# Patient Record
Sex: Female | Born: 1990 | Race: White | Hispanic: No | Marital: Married | Smoking: Never smoker
Health system: Southern US, Community
[De-identification: ages and names within clinical notes are randomized; demographics above are authoritative.]

## PROBLEM LIST (undated history)

## (undated) DIAGNOSIS — E669 Obesity, unspecified: Secondary | ICD-10-CM

## (undated) DIAGNOSIS — G43909 Migraine, unspecified, not intractable, without status migrainosus: Secondary | ICD-10-CM

## (undated) DIAGNOSIS — N946 Dysmenorrhea, unspecified: Secondary | ICD-10-CM

## (undated) HISTORY — DX: Migraine, unspecified, not intractable, without status migrainosus: G43.909

## (undated) HISTORY — DX: Obesity, unspecified: E66.9

## (undated) HISTORY — DX: Dysmenorrhea, unspecified: N94.6

## (undated) HISTORY — PX: OTHER SURGICAL HISTORY: SHX169

---
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
## 2008-05-11 ENCOUNTER — Ambulatory Visit: Payer: Self-pay | Admitting: Internal Medicine

## 2008-05-11 IMAGING — US ABDOMEN ULTRASOUND
1 series · 17 of 25 positions shown · non-contrast
Comparison: none

REASON FOR EXAM: abd pain
COMMENTS:

[Series 1: abdomen ultrasound · 17 of 72 slices shown]
[im 1/72]
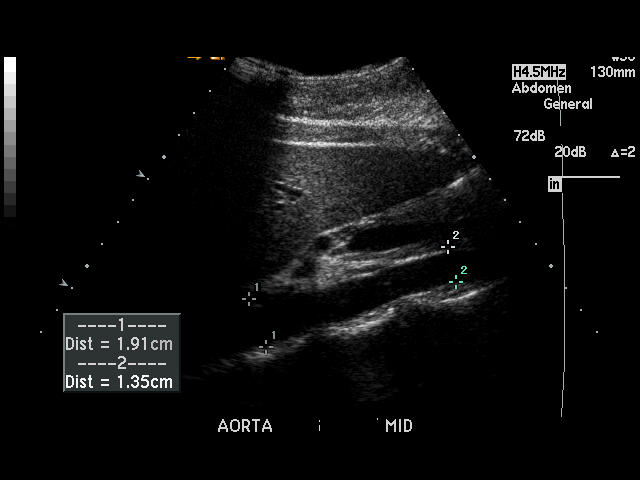
[im 6/72]
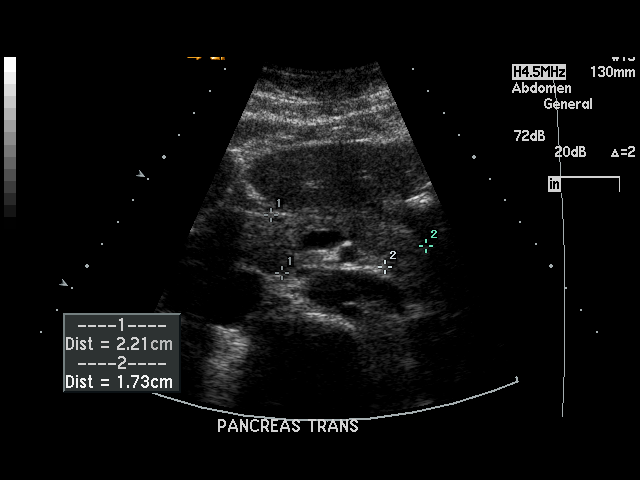
[im 9/72]
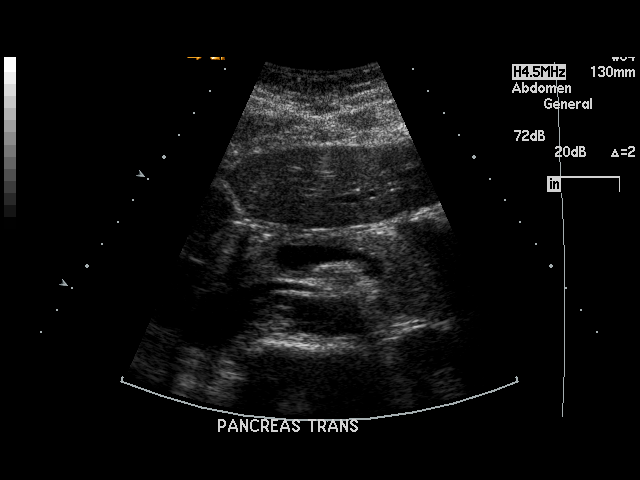
[im 15/72]
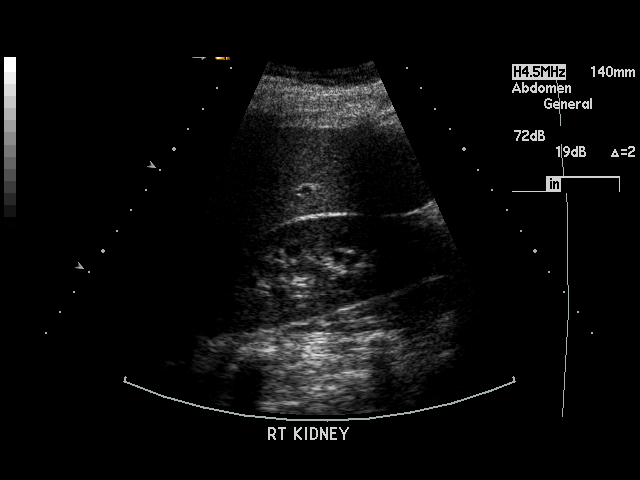
[im 18/72]
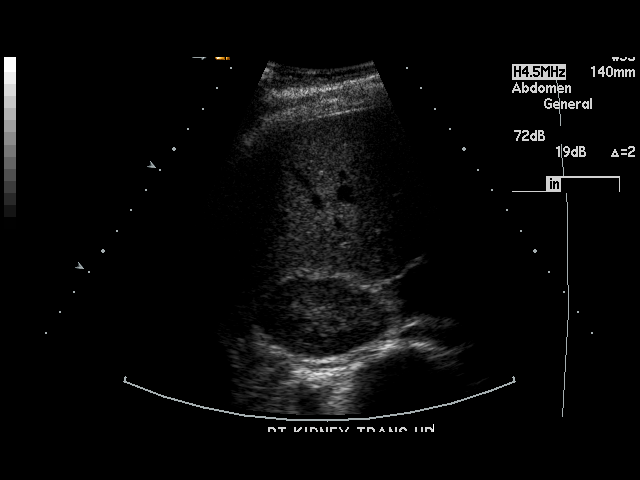
[im 24/72]
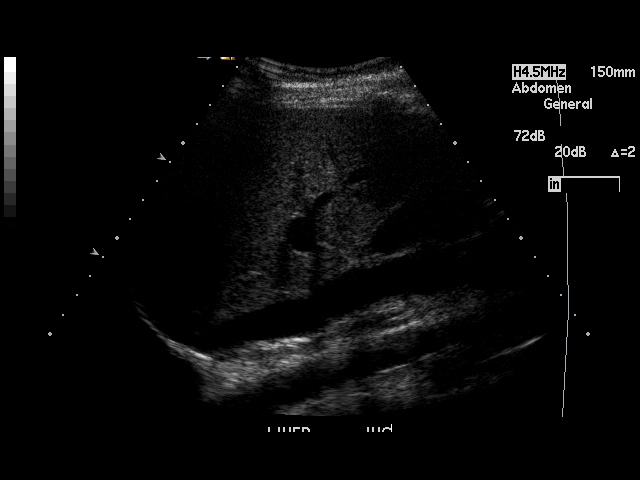
[im 27/72]
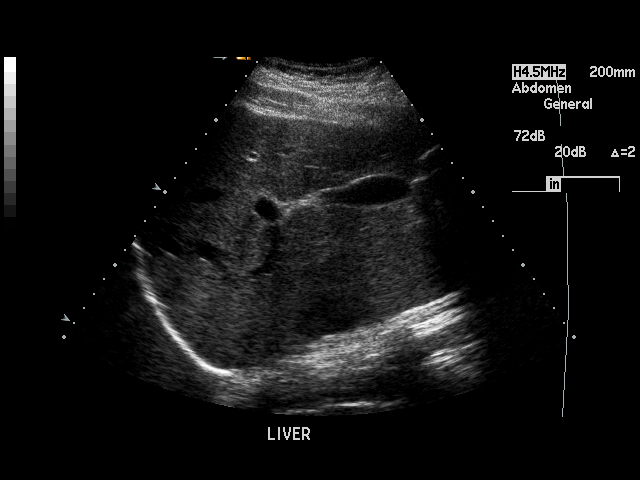
[im 33/72]
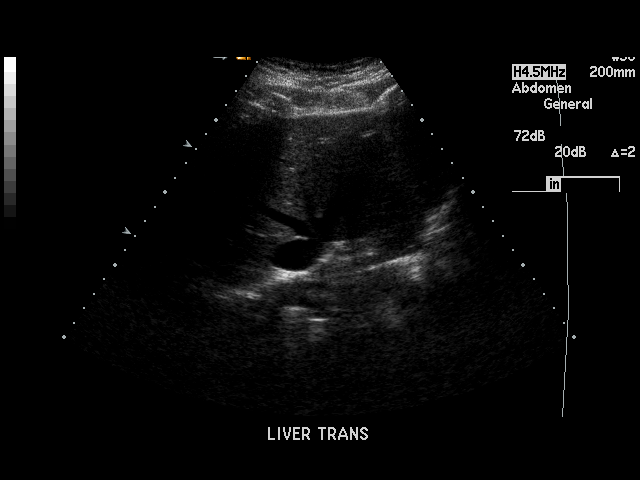
[im 36/72]
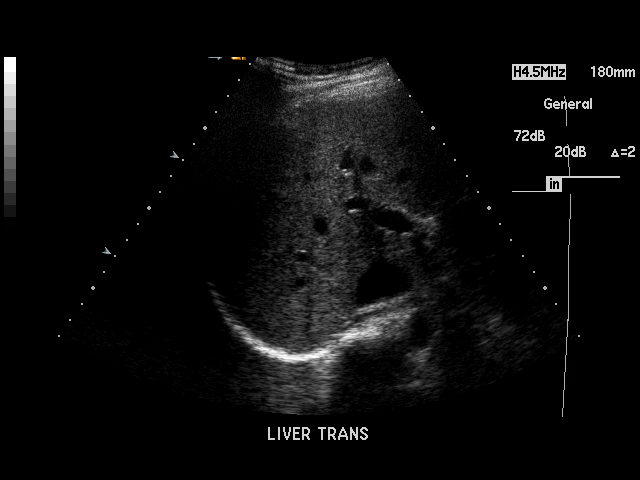
[im 39/72]
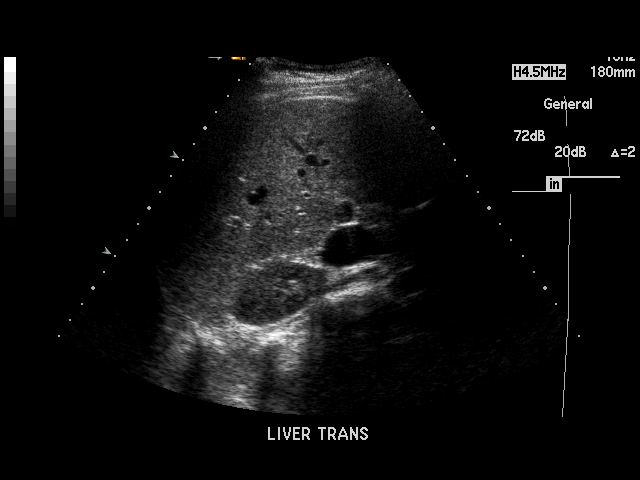
[im 45/72]
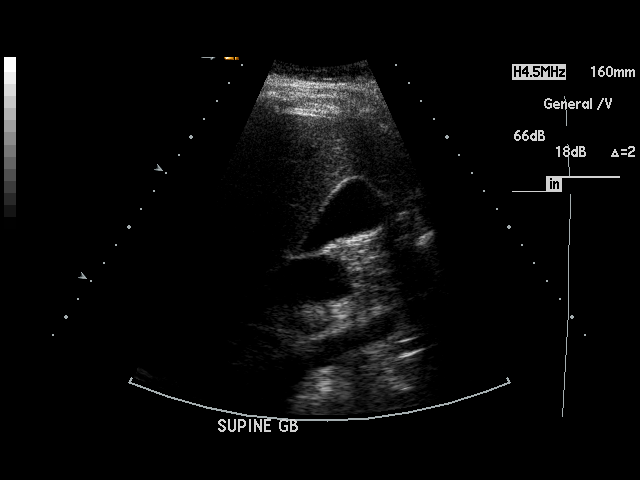
[im 48/72]
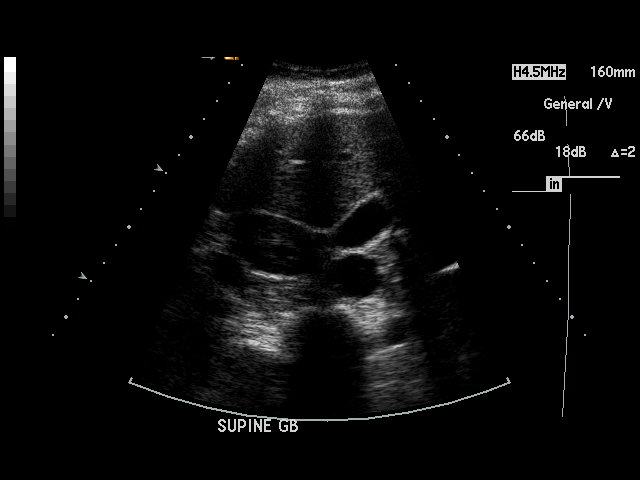
[im 54/72]
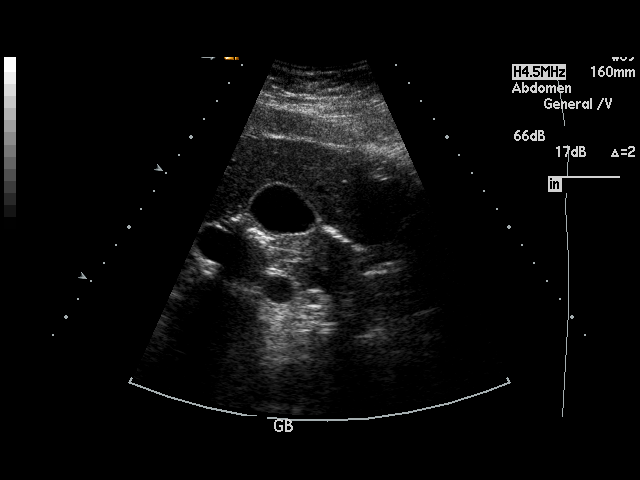
[im 57/72]
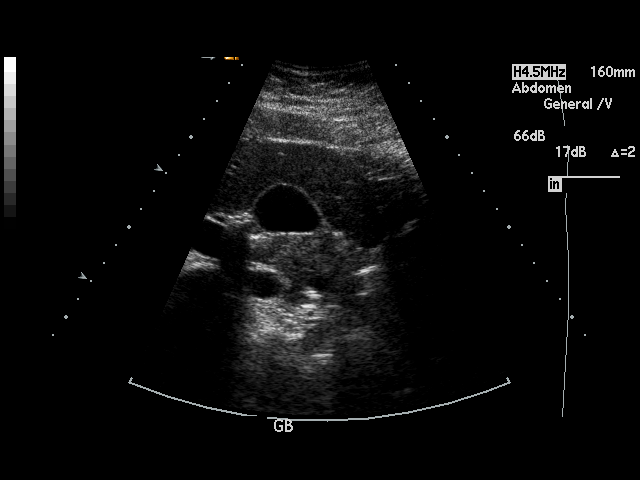
[im 63/72]
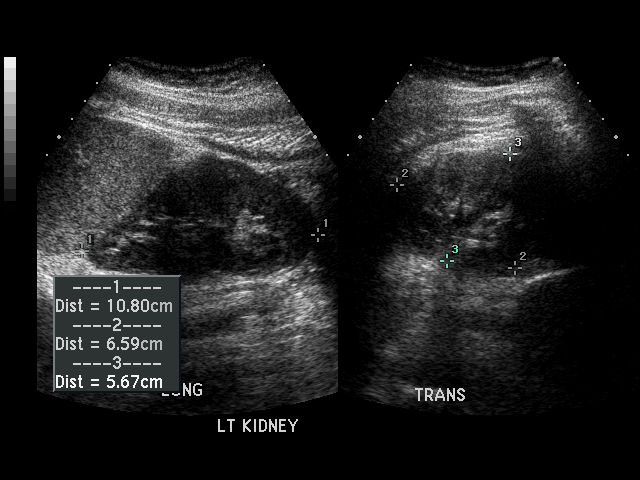
[im 66/72]
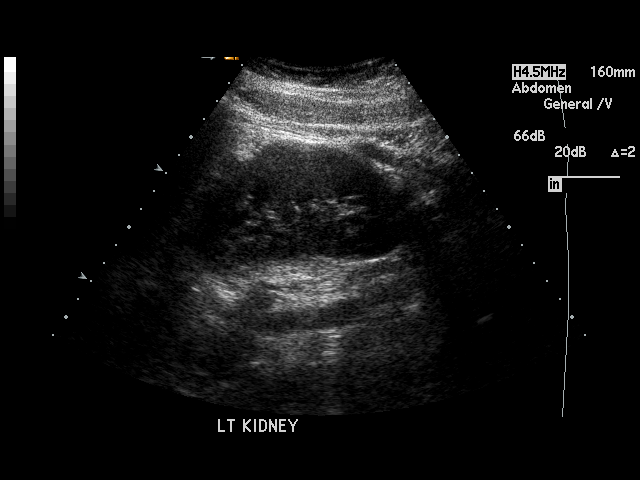
[im 72/72]
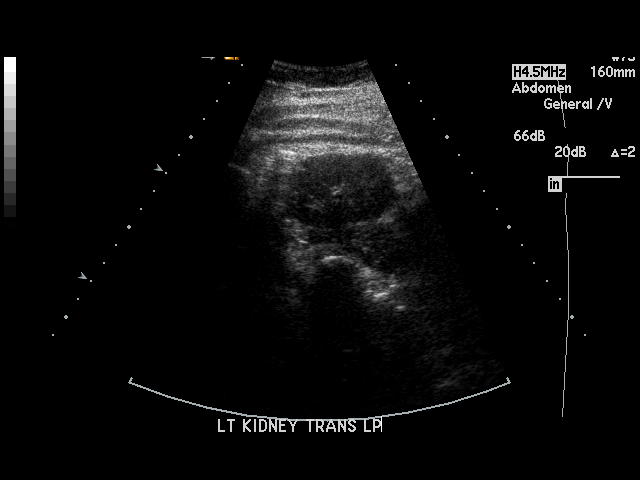

[17 of 25 positions shown; findings below may reference images not displayed]

PROCEDURE:     US  - US ABDOMEN GENERAL SURVEY  - [DATE]  [DATE]

RESULT:     Ultrasound of the abdomen is performed.

The patient reports a history of previous splenic fracture or laceration.
The study demonstrates a normal appearance of the visualized abdominal aorta
and inferior vena cava. The included portion of the pancreas appears
unremarkable. The liver shows normal echotexture with no mass or ductal
dilation. The portal venous flow is appropriate. The gallbladder shows no
evidence of stones. The wall thickness is 2.3 mm. The common bile duct
diameter is 3.3 mm. The spleen does show evidence of a central linear
increased echogenic appearance which may represent fibrosis from previous
trauma. The kidneys are unremarkable.
IMPRESSION: Probable fibrosis from a previous splenic injury. No acute
abnormality.

## 2008-08-04 ENCOUNTER — Ambulatory Visit: Payer: Self-pay | Admitting: Gastroenterology

## 2008-08-10 ENCOUNTER — Ambulatory Visit: Payer: Self-pay | Admitting: Gastroenterology

## 2008-08-10 IMAGING — CR DG SMALL BOWEL
1 series · 5 of 5 positions shown · non-contrast
Comparison: none

REASON FOR EXAM: nausea vomiting periumbilical pain
COMMENTS:

PROCEDURE:     FL  - FL SMALL BOWEL  - [DATE] [DATE]
RESULT:     Indication: Nausea and vomiting

[Series 1: view not recorded · 0.17mm/px · 5 of 5 slices shown]
[im 1/5]
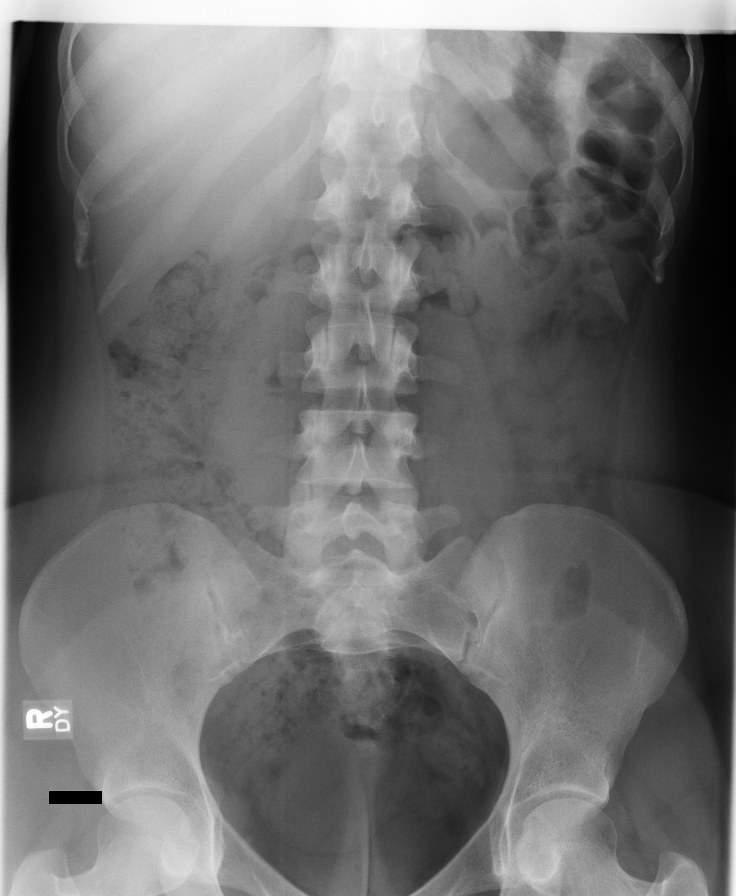
[im 2/5]
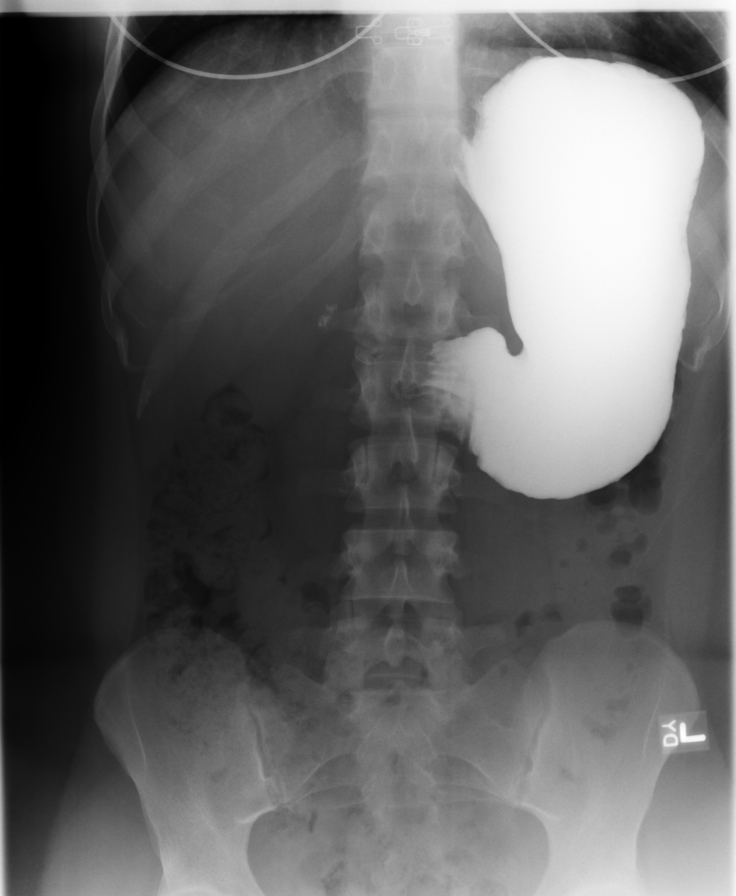
[im 3/5]
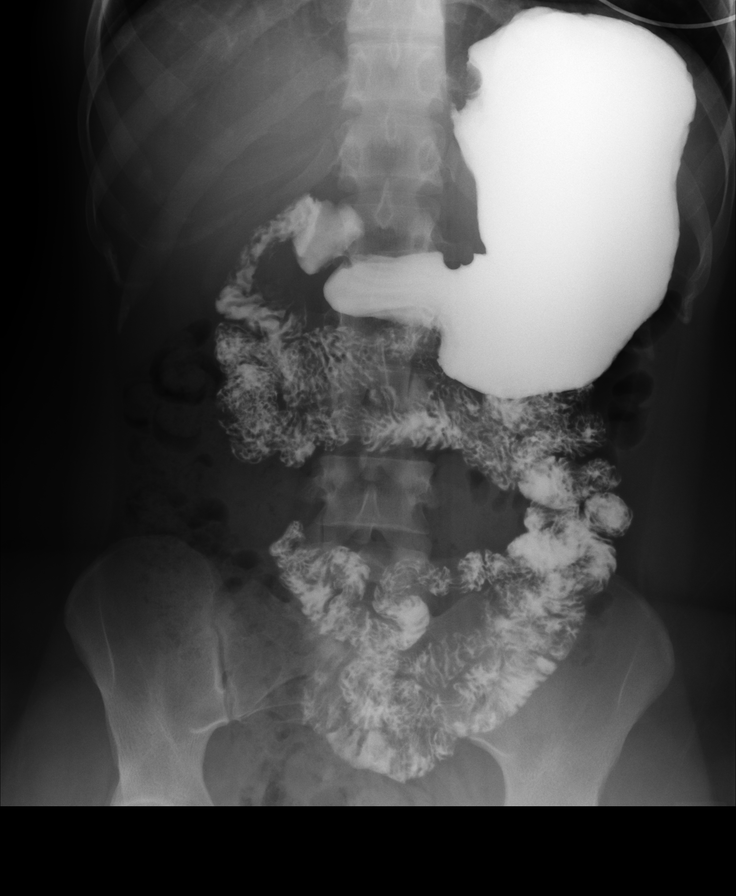
[im 4/5]
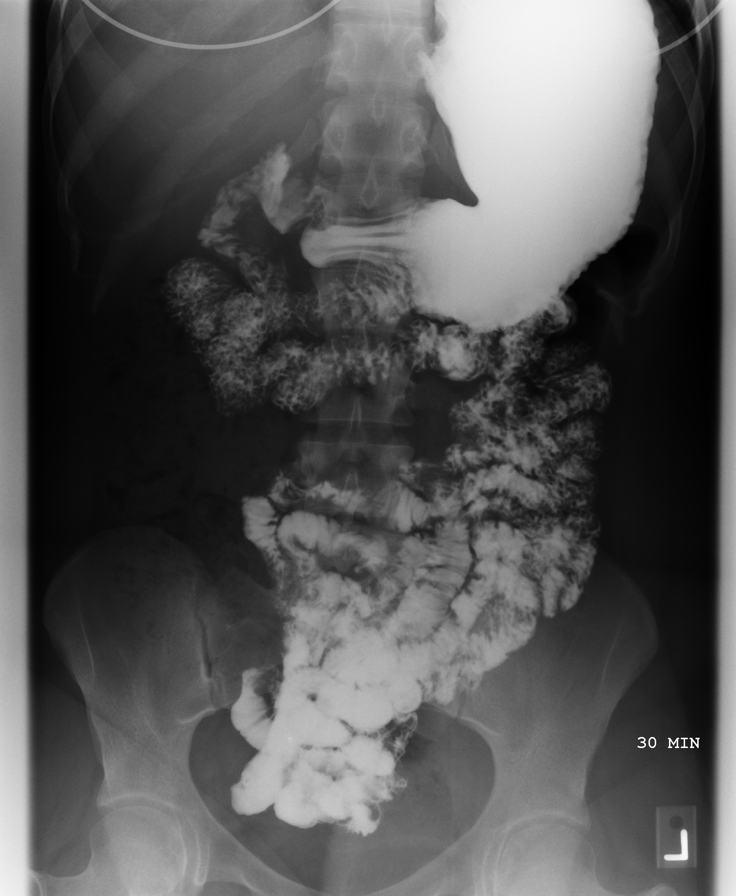
[im 5/5]
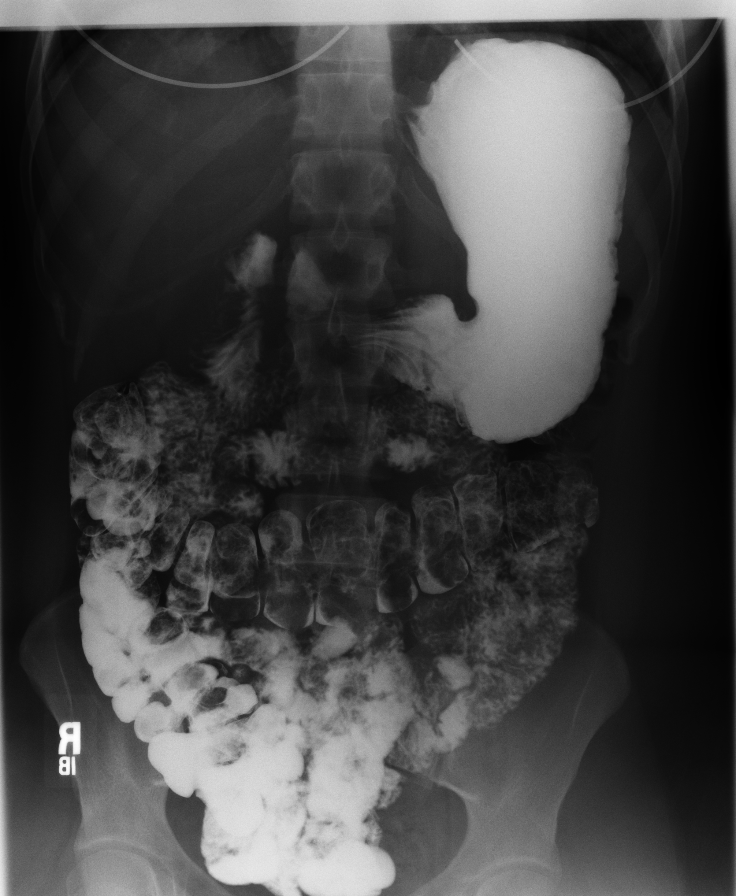

[5 of 5 positions shown; findings below may reference images not displayed]

FINDINGS: Scout frontal abdominal and pelvic radiograph demonstrates a nonspecific
bowel gas pattern..

Medium density barium was periodically observed under fluoroscopy to travel
from the stomach to the ascending colon (over a 45 minute time period).
There is no evidence of small bowel stricture or obstruction. No large
filling defects to suggest mass lesion. In addition, there is no evidence of
tethering or definite inflammatory changes present within the small bowel.
IMPRESSION: Normal small bowel follow-through study without evidence of tethering, mass
lesion, or obstruction within the small bowel.

## 2008-08-10 IMAGING — RF DG SMALL BOWEL
1 series · 6 of 6 positions shown · non-contrast
Comparison: none

REASON FOR EXAM: nausea vomiting periumbilical pain
COMMENTS:

PROCEDURE:     FL  - FL SMALL BOWEL  - [DATE] [DATE]
RESULT:     Indication: Nausea and vomiting

[Series 1: run · 6 of 6 slices shown]
[im 1/6]
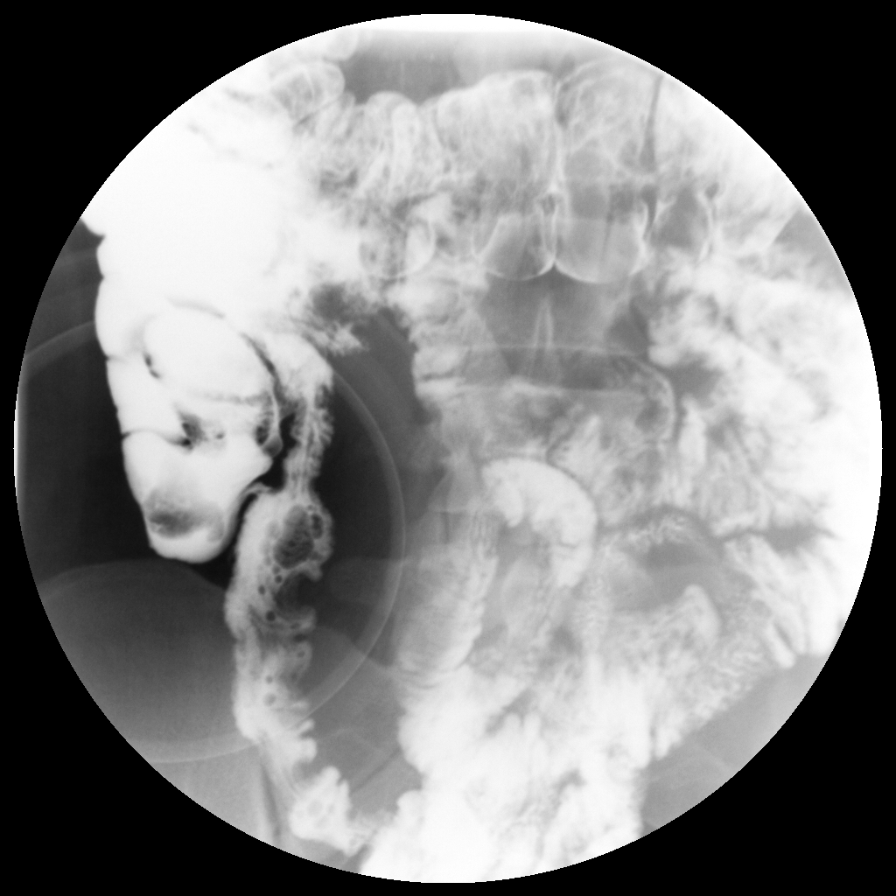
[im 2/6]
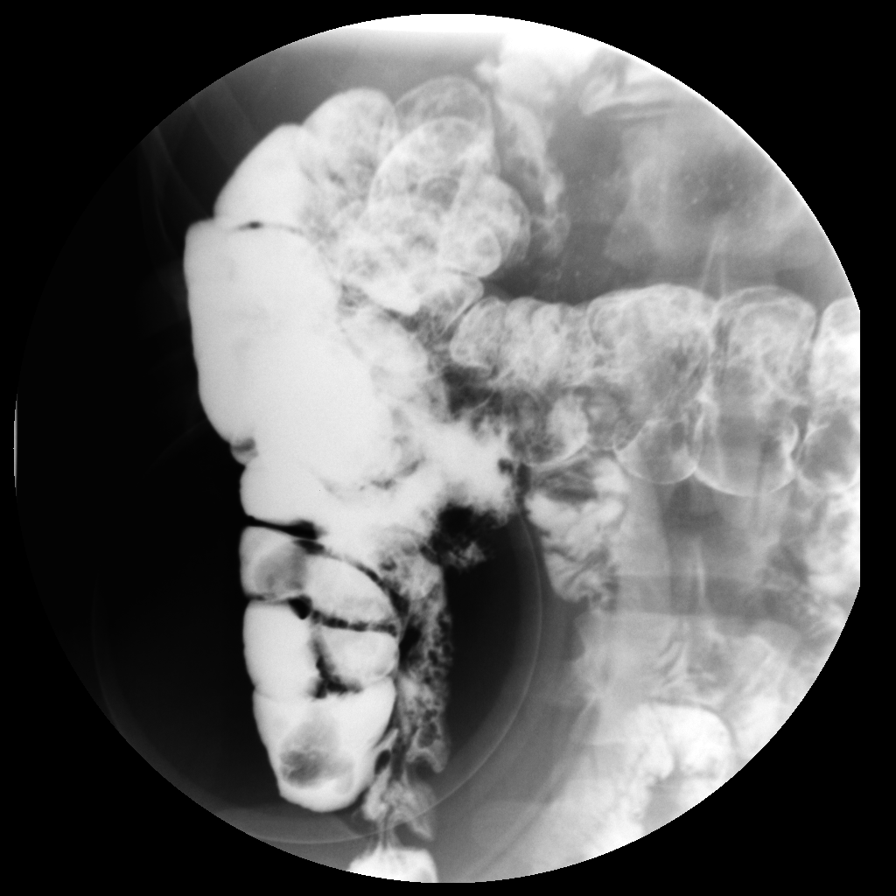
[im 3/6]
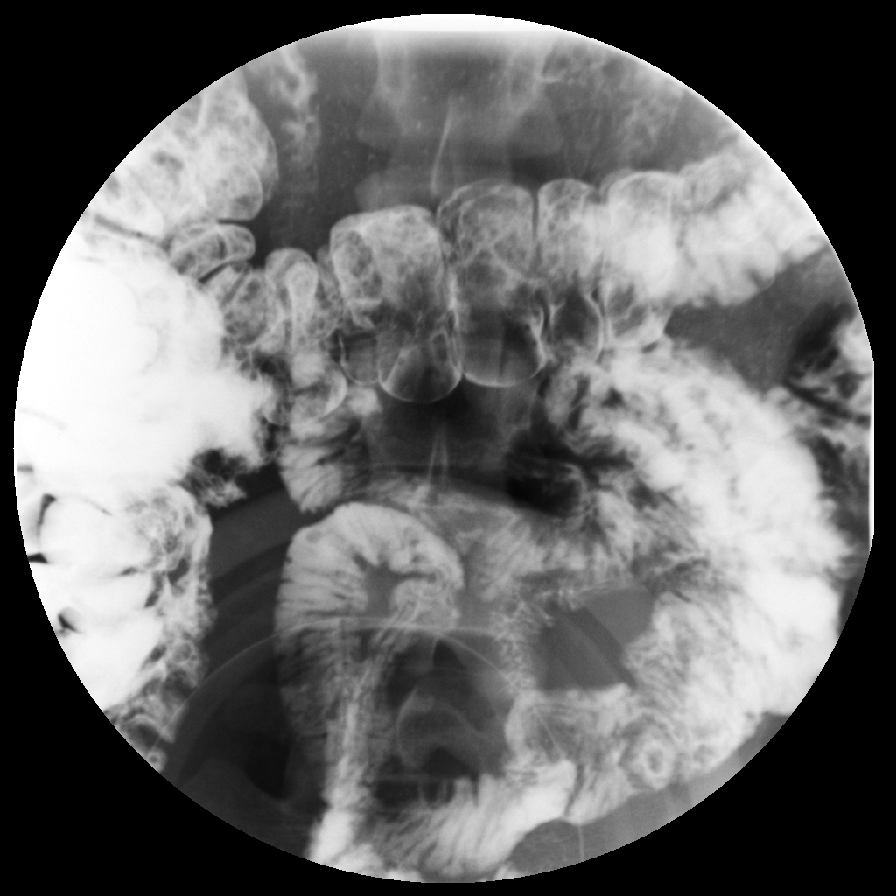
[im 4/6]
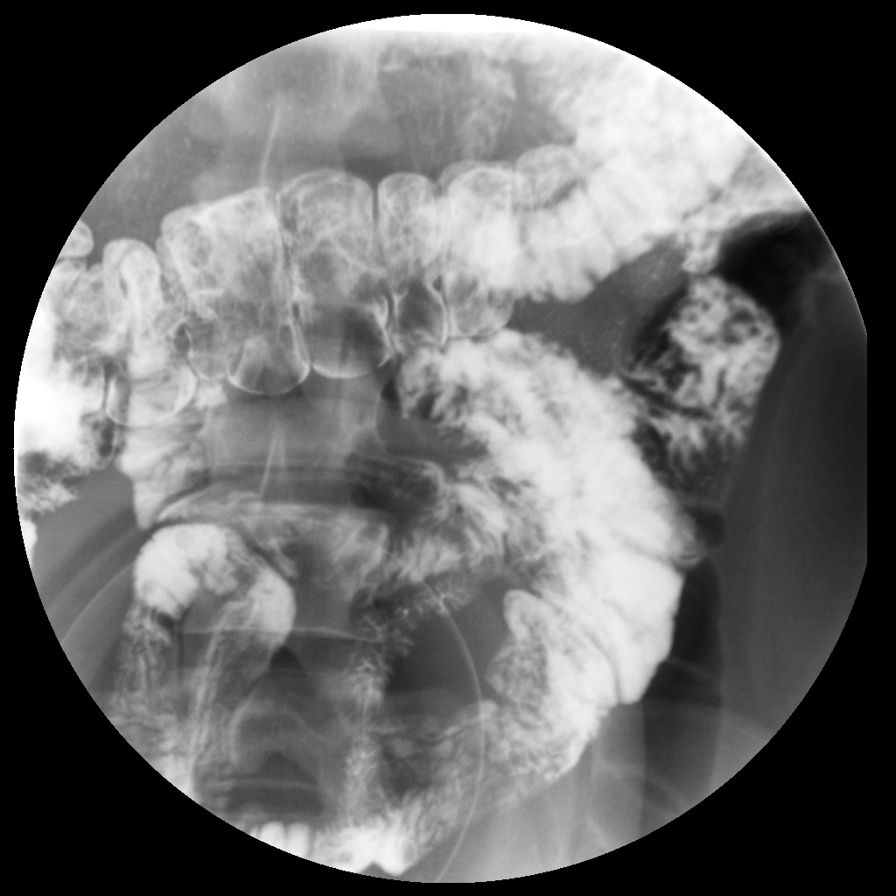
[im 5/6]
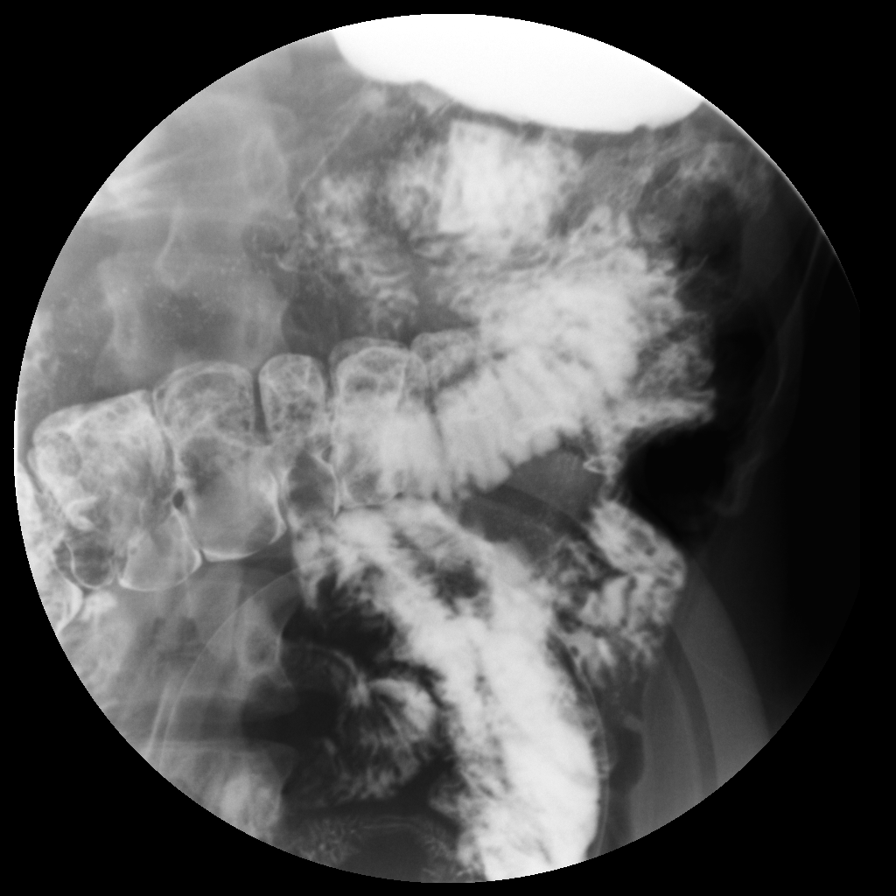
[im 6/6]
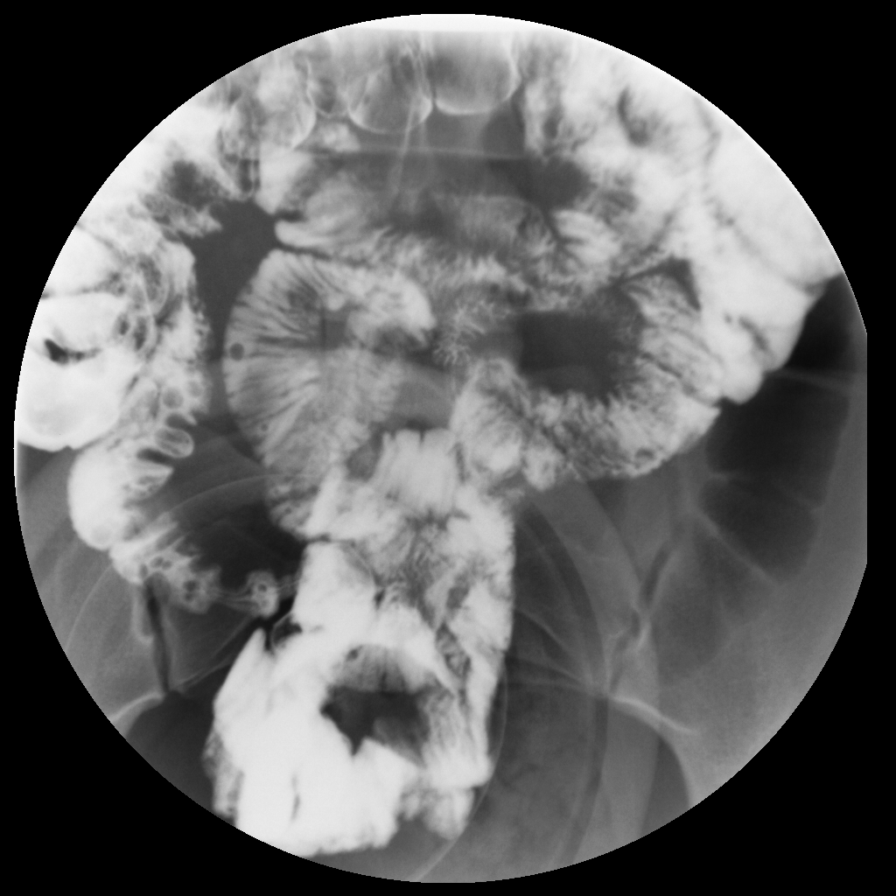

[6 of 6 positions shown; findings below may reference images not displayed]

FINDINGS: Scout frontal abdominal and pelvic radiograph demonstrates a nonspecific
bowel gas pattern..

Medium density barium was periodically observed under fluoroscopy to travel
from the stomach to the ascending colon (over a 45 minute time period).
There is no evidence of small bowel stricture or obstruction. No large
filling defects to suggest mass lesion. In addition, there is no evidence of
tethering or definite inflammatory changes present within the small bowel.
IMPRESSION: Normal small bowel follow-through study without evidence of tethering, mass
lesion, or obstruction within the small bowel.

## 2008-12-15 ENCOUNTER — Ambulatory Visit: Payer: Self-pay | Admitting: Internal Medicine

## 2008-12-15 IMAGING — CT CT HEAD WITHOUT CONTRAST
2 series · 16 of 30 positions shown, 20 images · non-contrast
Comparison: none

REASON FOR EXAM: headache   worse of life
COMMENTS:

PROCEDURE:     CT  - CT HEAD WITHOUT CONTRAST  - [DATE]  [DATE]
RESULT:     Comparison:  None
TECHNIQUE: Multiple axial images from the foramen magnum to the vertex were
obtained without IV contrast.

[Series 2: without · axial · non-contrast · 0.41mm/px · z∈[-35,+85]mm · 13 of 29 slices shown, 17 images]
[im 3/29  brain]
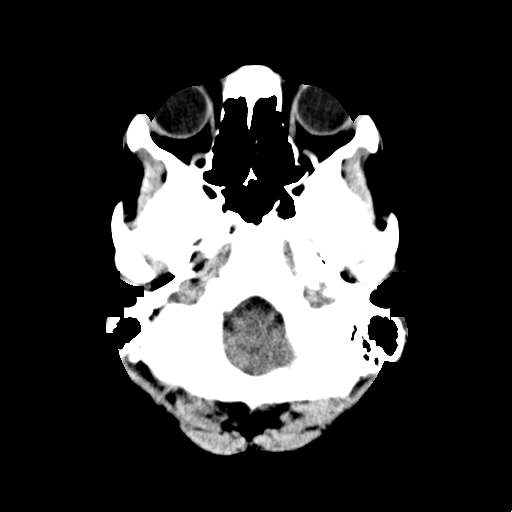
[im 3/29  bone]
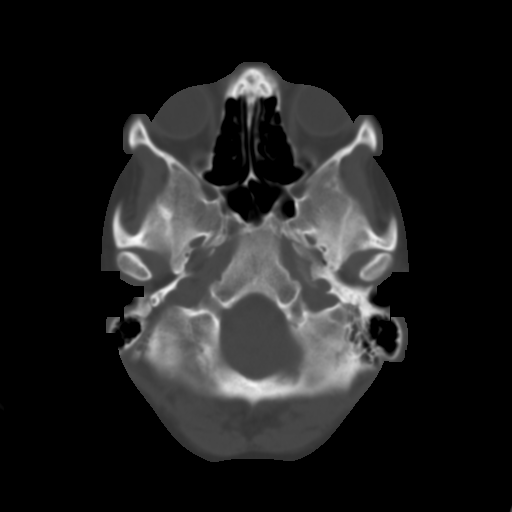
[im 5/29  brain]
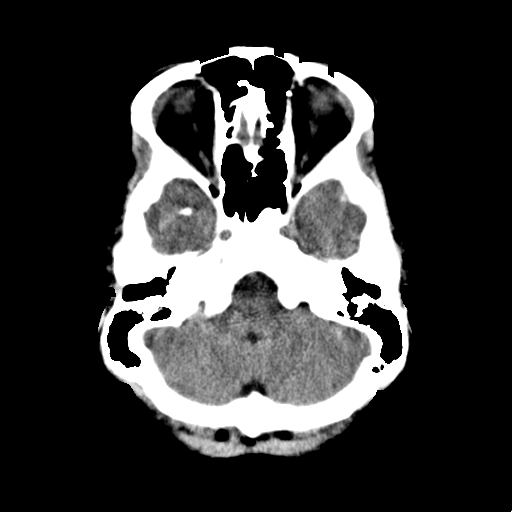
[im 7/29  brain]
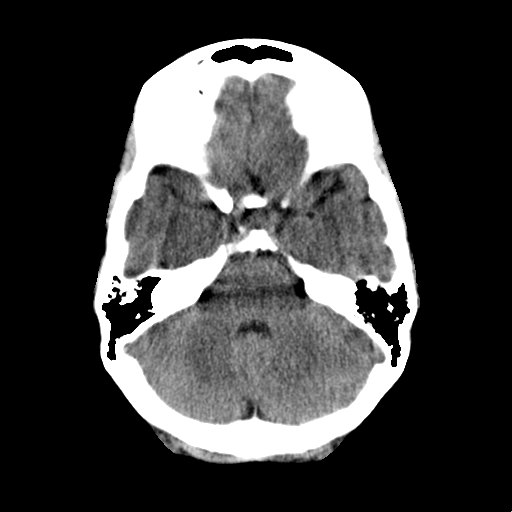
[im 9/29  brain]
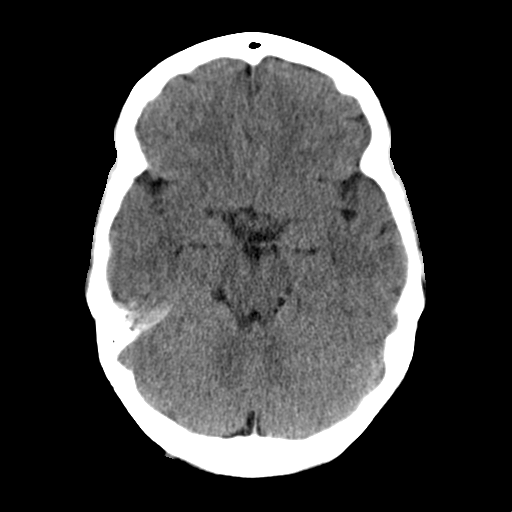
[im 11/29  brain]
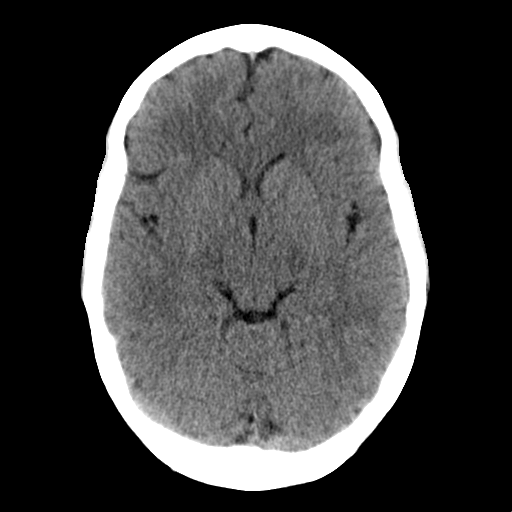
[im 11/29  bone]
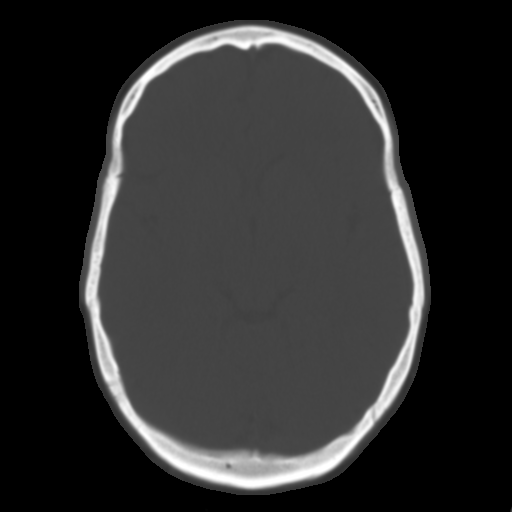
[im 13/29  brain]
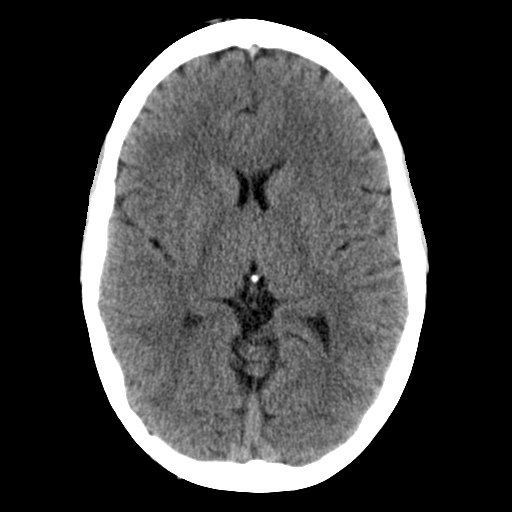
[im 15/29  brain]
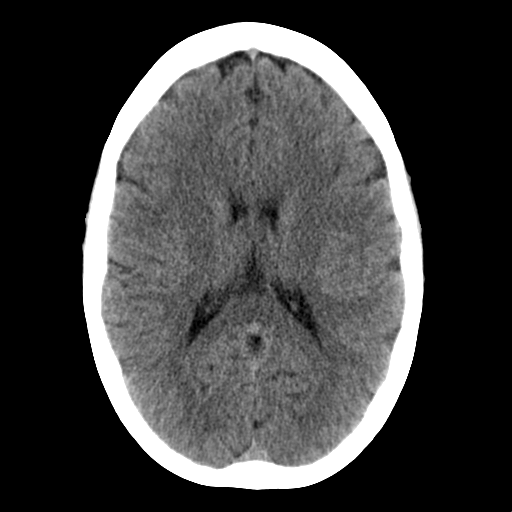
[im 17/29  brain]
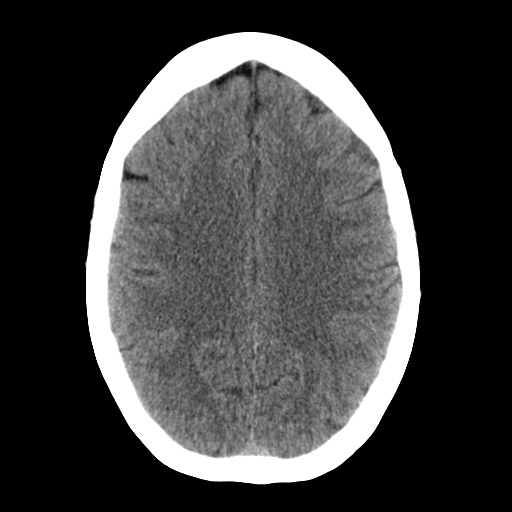
[im 19/29  brain]
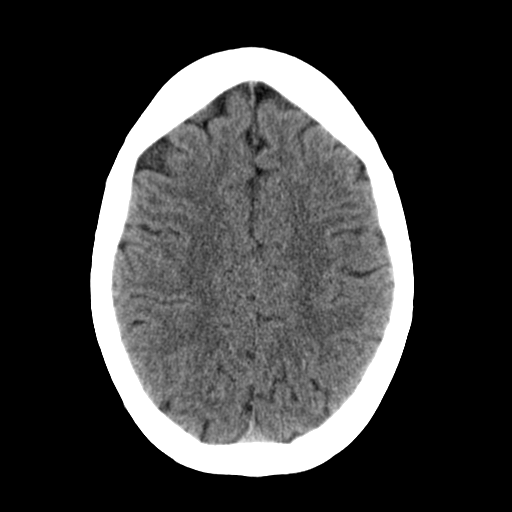
[im 19/29  bone]
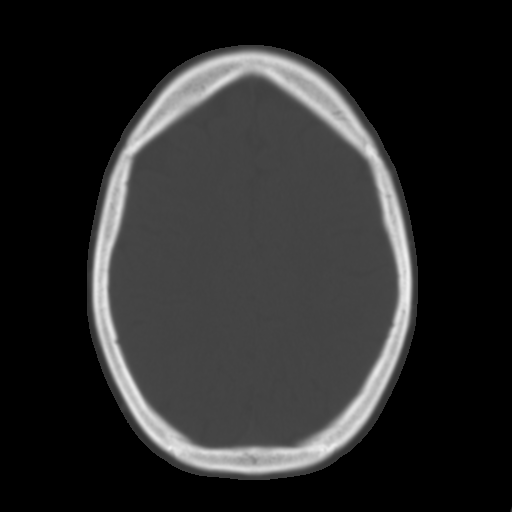
[im 21/29  brain]
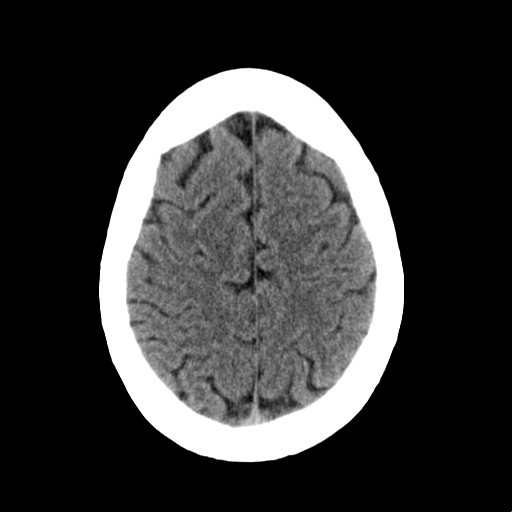
[im 23/29  brain]
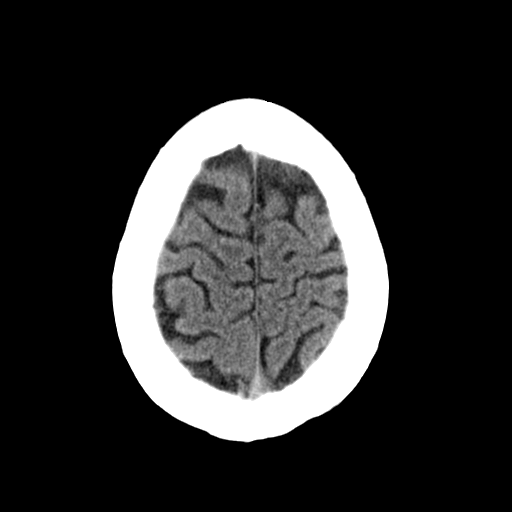
[im 25/29  brain]
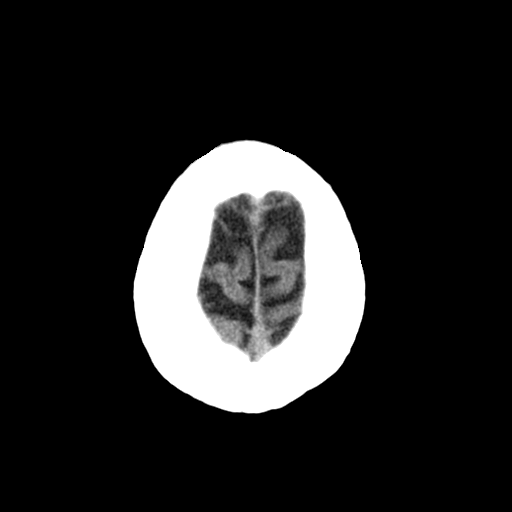
[im 27/29  brain]
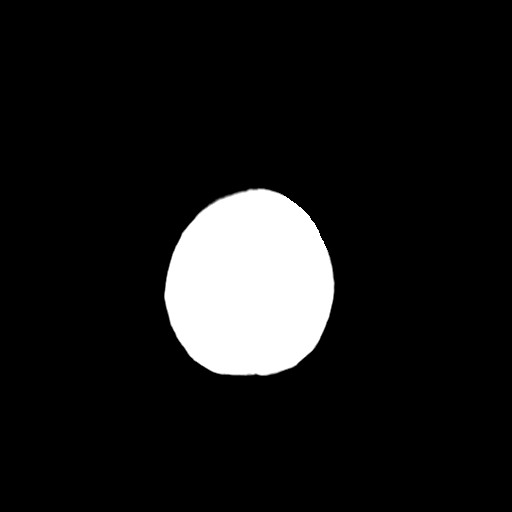
[im 27/29  bone]
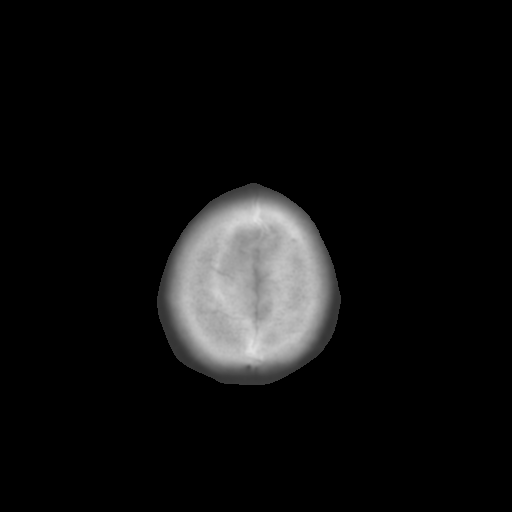

[Series 3: bone · axial · 0.41mm/px · z∈[-35,+5]mm · 3 of 29 slices shown]
[im 3/29  bone]
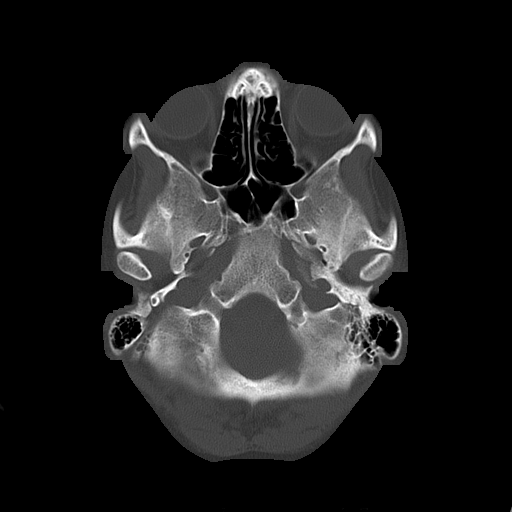
[im 7/29  bone]
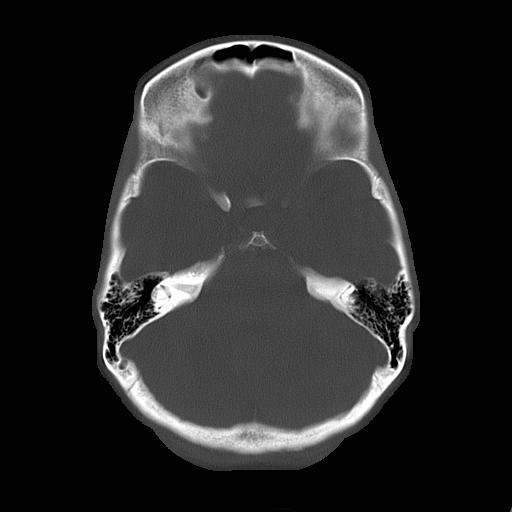
[im 11/29  bone]
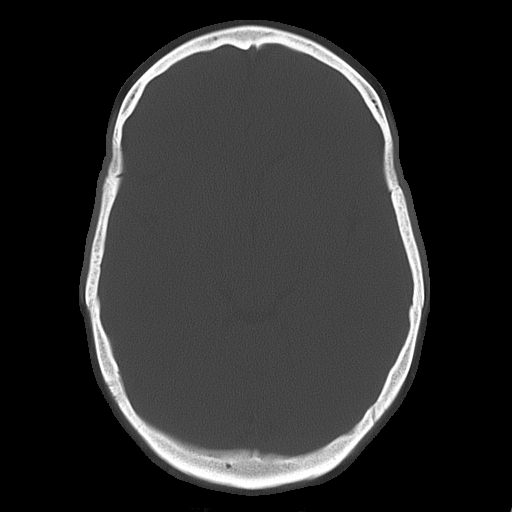

[16 of 30 positions shown; findings below may reference images not displayed]

FINDINGS: There is no evidence of mass effect, midline shift, or extra-axial fluid
collections.  There is no evidence of a space-occupying lesion or
intracranial hemorrhage. There is no evidence of a cortical-based area of
acute infarction.

The ventricles and sulci are appropriate for the patient's age. The basal
cisterns are patent.

Visualized portions of the orbits are unremarkable. The visualized portions
of the paranasal sinuses and mastoid air cells are unremarkable.

The osseous structures are unremarkable.
IMPRESSION: No acute intracranial process.

## 2009-01-08 ENCOUNTER — Emergency Department: Payer: Self-pay | Admitting: Unknown Physician Specialty

## 2009-08-28 ENCOUNTER — Emergency Department: Payer: Self-pay | Admitting: Emergency Medicine

## 2010-09-18 ENCOUNTER — Ambulatory Visit: Payer: Self-pay | Admitting: Neurology

## 2011-02-03 ENCOUNTER — Emergency Department: Payer: Self-pay | Admitting: Emergency Medicine

## 2011-02-03 IMAGING — CT CT HEAD WITHOUT CONTRAST
2 series · 16 of 30 positions shown, 20 images · non-contrast
Comparison: none

REASON FOR EXAM: headache
COMMENTS:

[Series 2: without · axial · non-contrast · 0.41mm/px · z∈[-198,-78]mm · 13 of 30 slices shown, 17 images]
[im 3/30  brain]
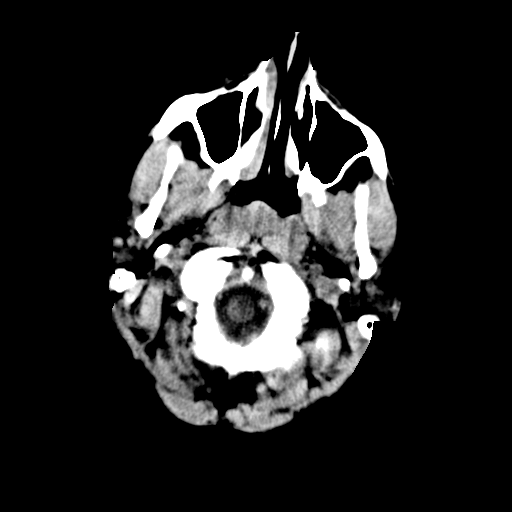
[im 3/30  bone]
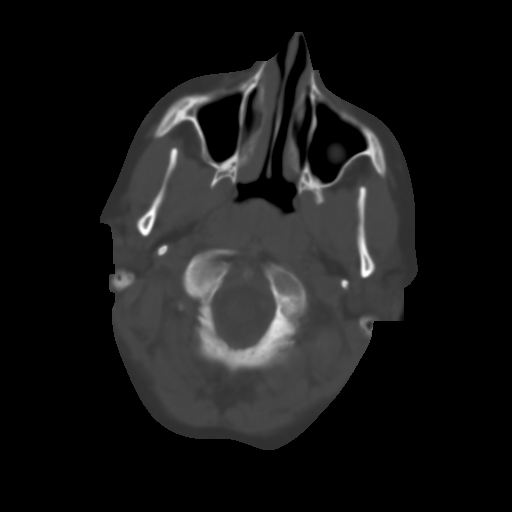
[im 5/30  brain]
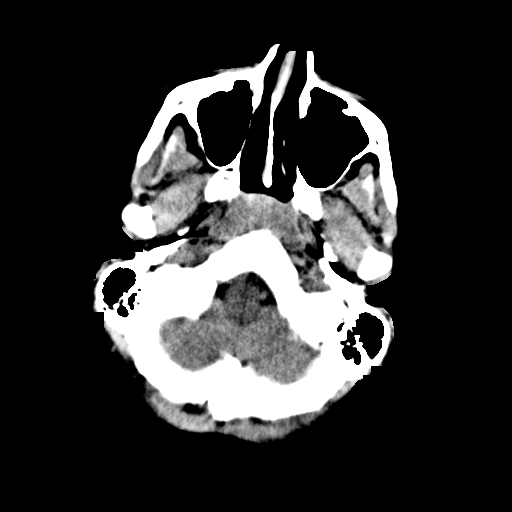
[im 7/30  brain]
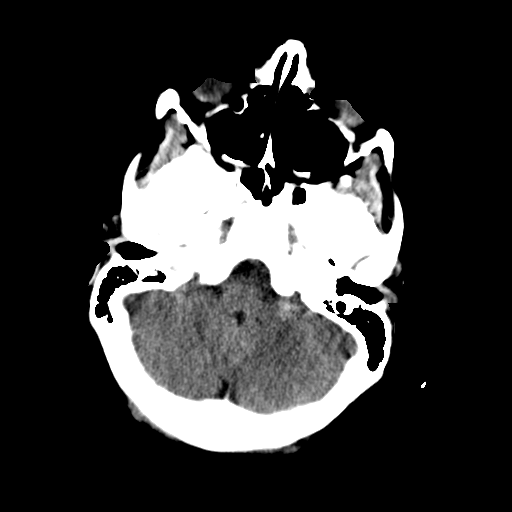
[im 9/30  brain]
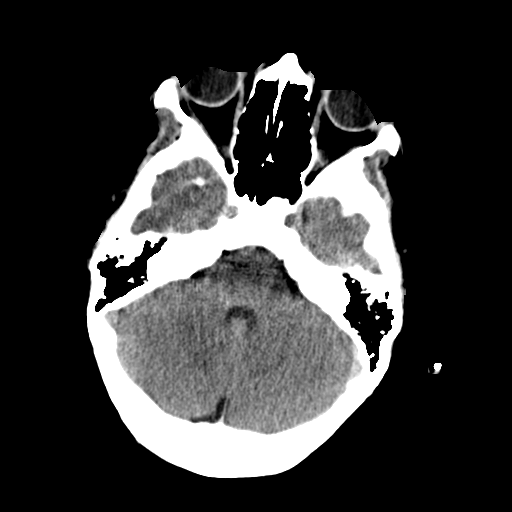
[im 11/30  brain]
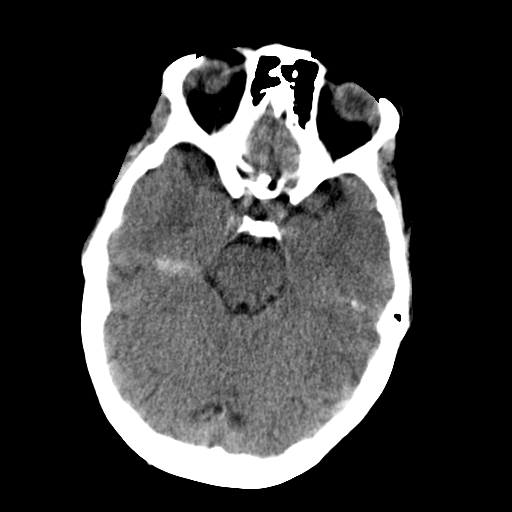
[im 11/30  bone]
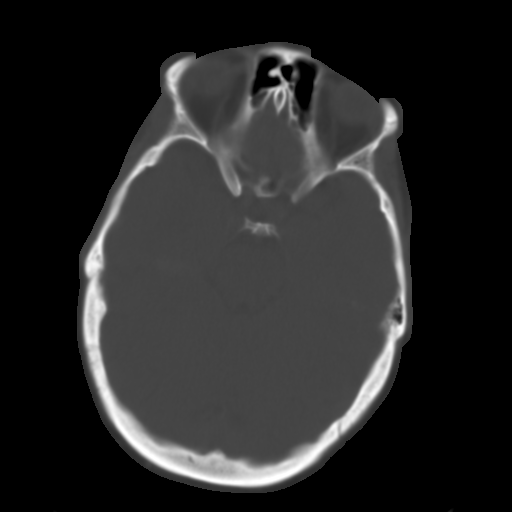
[im 13/30  brain]
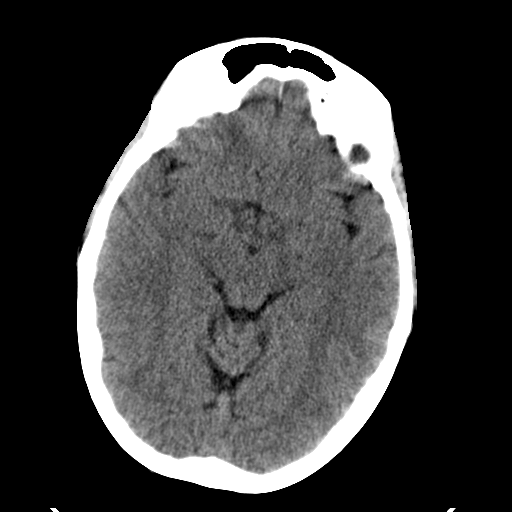
[im 15/30  brain]
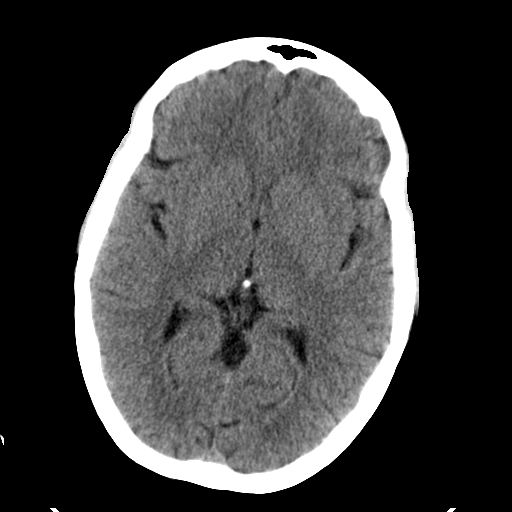
[im 17/30  brain]
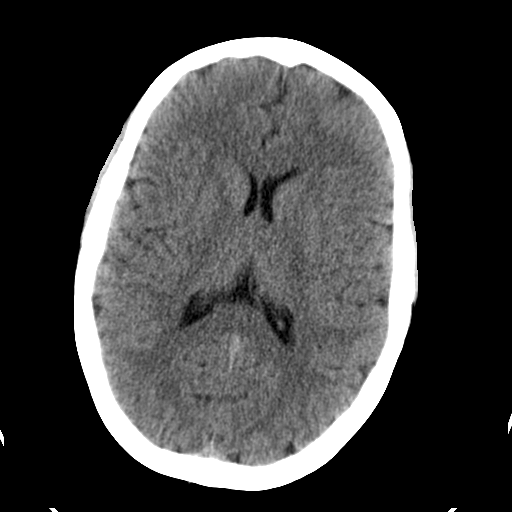
[im 19/30  brain]
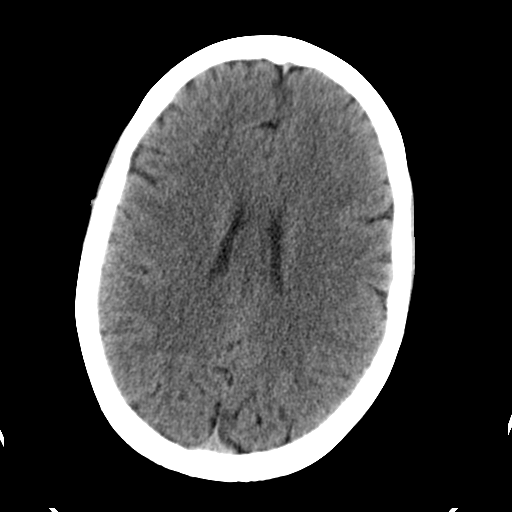
[im 19/30  bone]
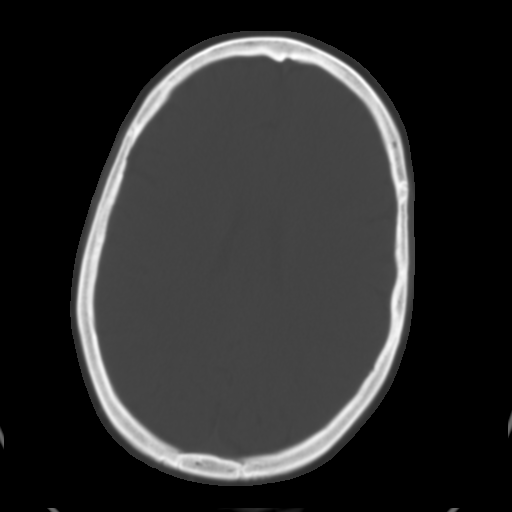
[im 21/30  brain]
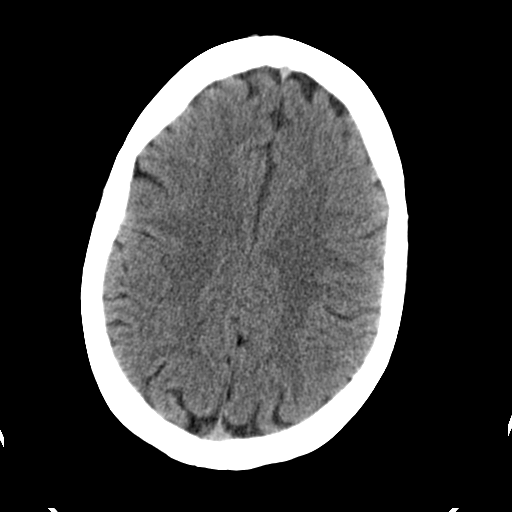
[im 23/30  brain]
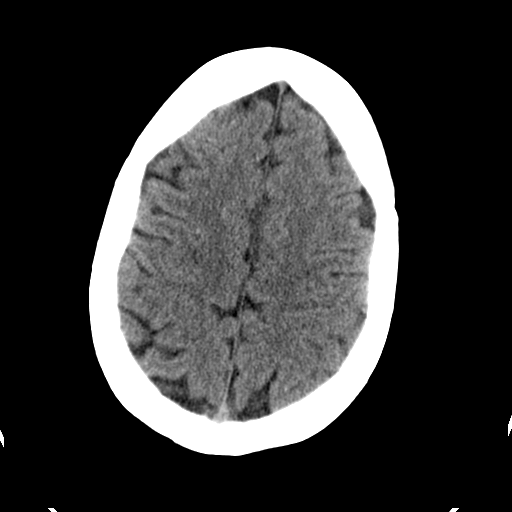
[im 25/30  brain]
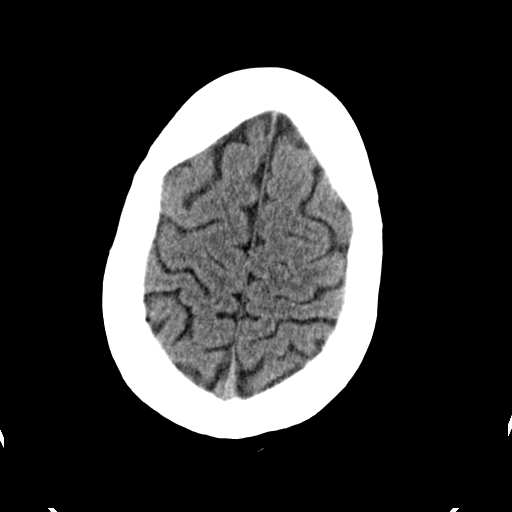
[im 27/30  brain]
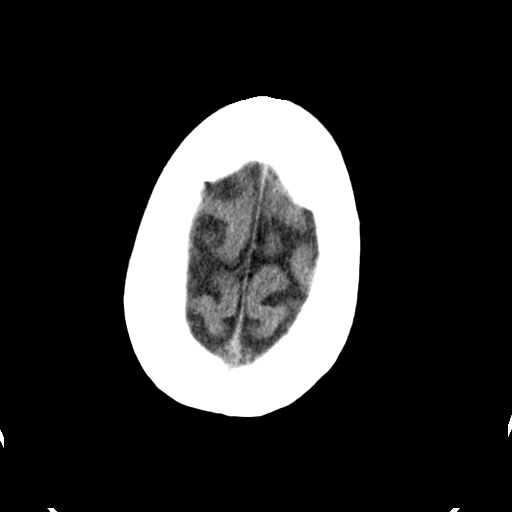
[im 27/30  bone]
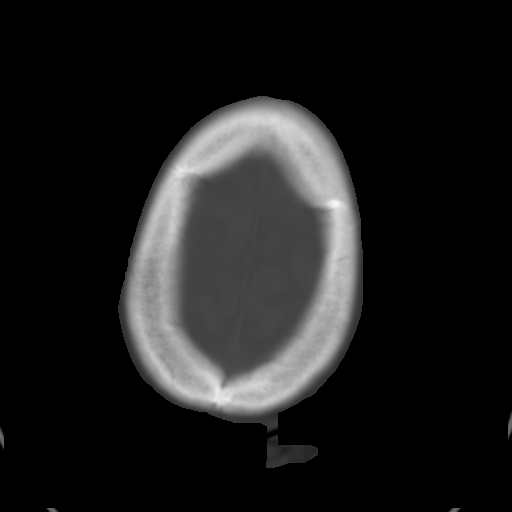

[Series 3: bone · axial · 0.41mm/px · z∈[-198,-158]mm · 3 of 30 slices shown]
[im 3/30  bone]
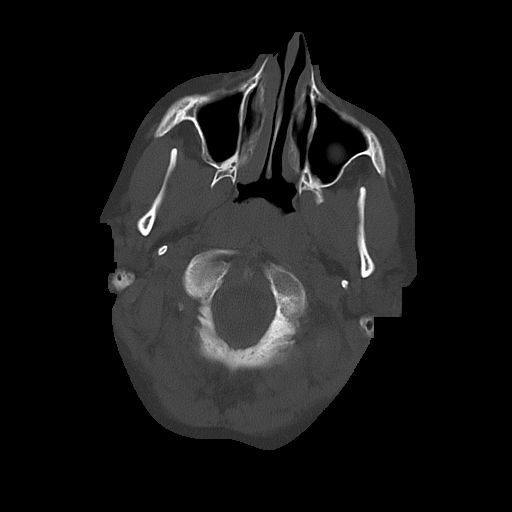
[im 7/30  bone]
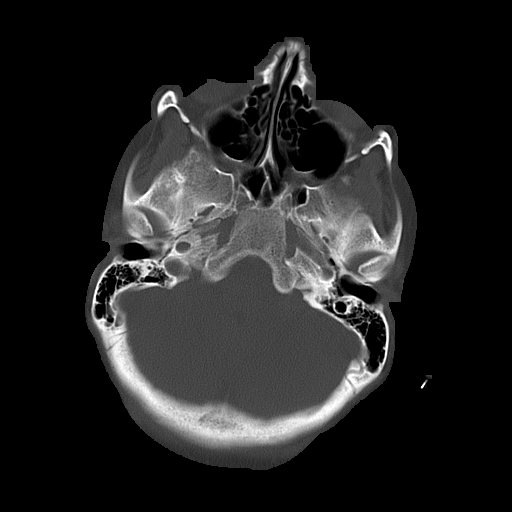
[im 11/30  bone]
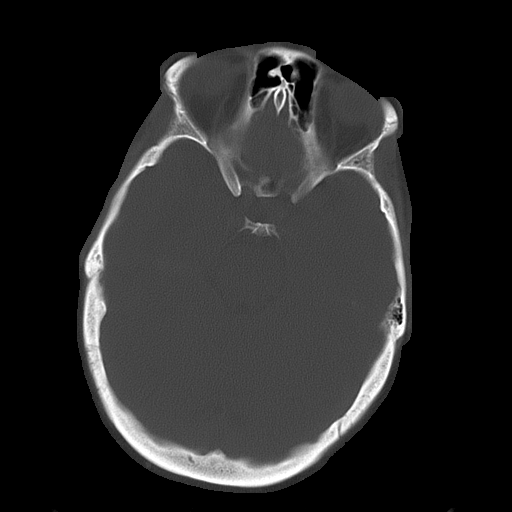

[16 of 30 positions shown; findings below may reference images not displayed]

PROCEDURE:     CT  - CT HEAD WITHOUT CONTRAST  - [DATE]  [DATE]

RESULT:     Axial noncontrast CT scanning was performed through the brain
with reconstructions at 5 mm intervals and slice thicknesses. Comparison
made to study [DATE].

The ventricles are normal in size and position. There is no intracranial
hemorrhage nor intracranial mass effect. The cerebellum and brainstem are
normal in density. At bone window settings the observed portions of the
paranasal sinuses and mastoid air cells are clear. There is likely a
retention cyst inferiorly in the left maxillary sinus.
IMPRESSION: Normal noncontrast CT scan of the brain.

## 2011-02-04 ENCOUNTER — Emergency Department: Payer: Self-pay | Admitting: Emergency Medicine

## 2011-11-01 ENCOUNTER — Emergency Department: Payer: Self-pay | Admitting: Unknown Physician Specialty

## 2011-11-01 LAB — COMPREHENSIVE METABOLIC PANEL
Albumin: 3.9 g/dL (ref 3.4–5.0)
Alkaline Phosphatase: 75 U/L (ref 50–136)
Anion Gap: 9 (ref 7–16)
Bilirubin,Total: 0.3 mg/dL (ref 0.2–1.0)
Calcium, Total: 9.3 mg/dL (ref 8.5–10.1)
Chloride: 107 mmol/L (ref 98–107)
Co2: 26 mmol/L (ref 21–32)
EGFR (African American): >60
EGFR (Non-African Amer.): >60
Glucose: 94 mg/dL (ref 65–99)
Osmolality: 283 (ref 275–301)
Potassium: 3.6 mmol/L (ref 3.5–5.1)
Sodium: 142 mmol/L (ref 136–145)

## 2011-11-01 LAB — CBC
HCT: 39.8 % (ref 35.0–47.0)
MCHC: 34.2 g/dL (ref 32.0–36.0)
MCV: 90 fL (ref 80–100)
RDW: 12.8 % (ref 11.5–14.5)
WBC: 9.9 10*3/uL (ref 3.6–11.0)

## 2011-11-01 LAB — URINALYSIS, COMPLETE
Nitrite: NEGATIVE
Ph: 7 (ref 4.5–8.0)
Protein: NEGATIVE
RBC,UR: 2 /HPF (ref 0–5)
Specific Gravity: 1.008 (ref 1.003–1.030)
WBC UR: 1 /HPF (ref 0–5)

## 2011-11-02 IMAGING — US ABDOMEN ULTRASOUND LIMITED
1 series · 14 of 23 positions shown · non-contrast
Comparison: none

REASON FOR EXAM: abd pain
COMMENTS:   Body Site: GB and Fossa, CBD, Head of Pancreas; Pancreas

[Series 1: abdomen ultrasound limited · 0.23mm/px · 14 of 23 slices shown]
[im 1/23]
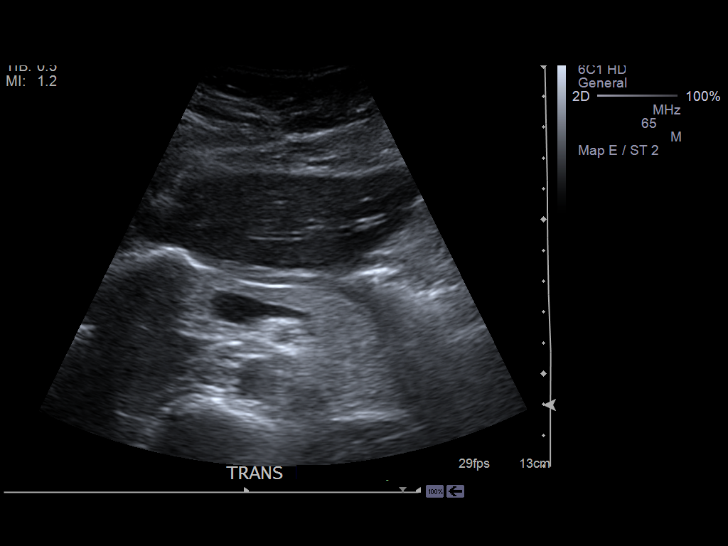
[im 3/23]
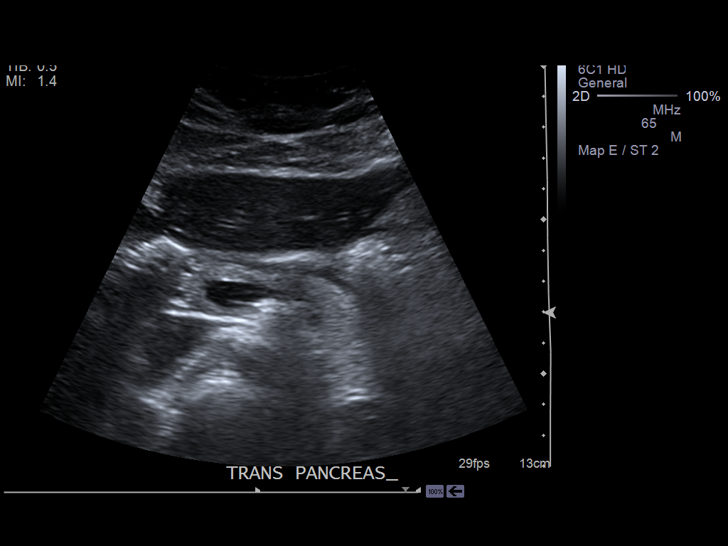
[im 5/23]
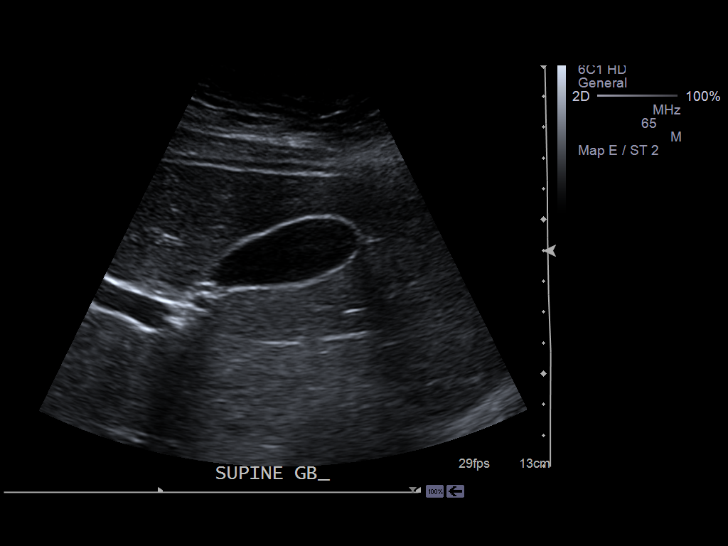
[im 6/23]
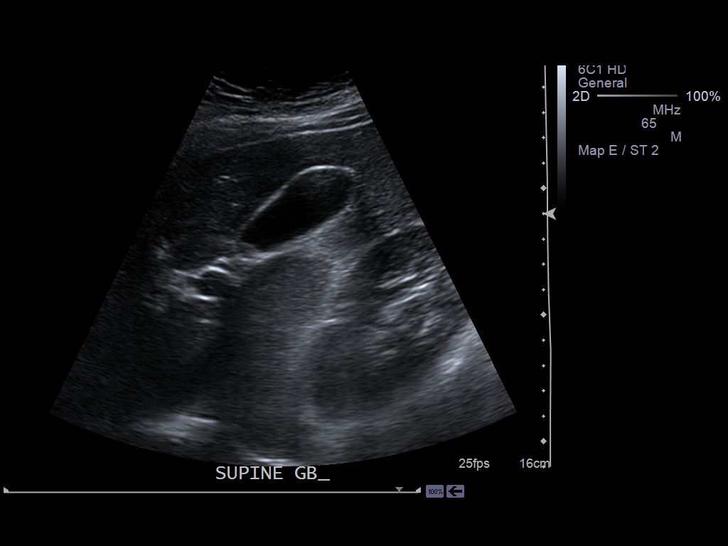
[im 8/23]
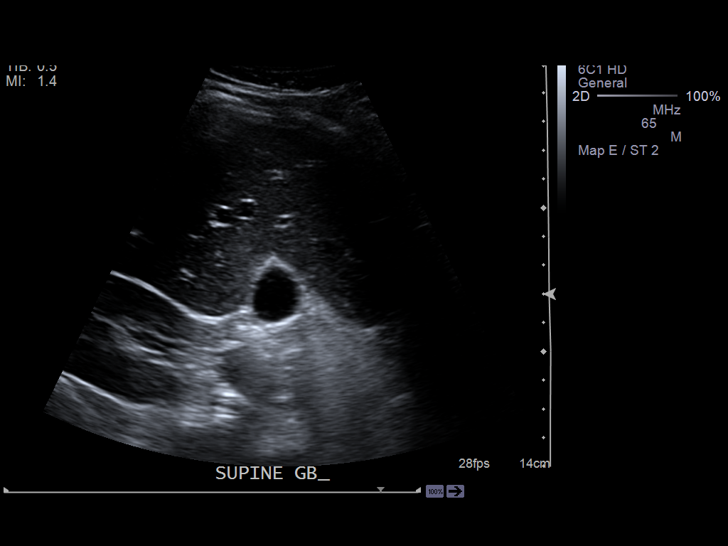
[im 10/23]
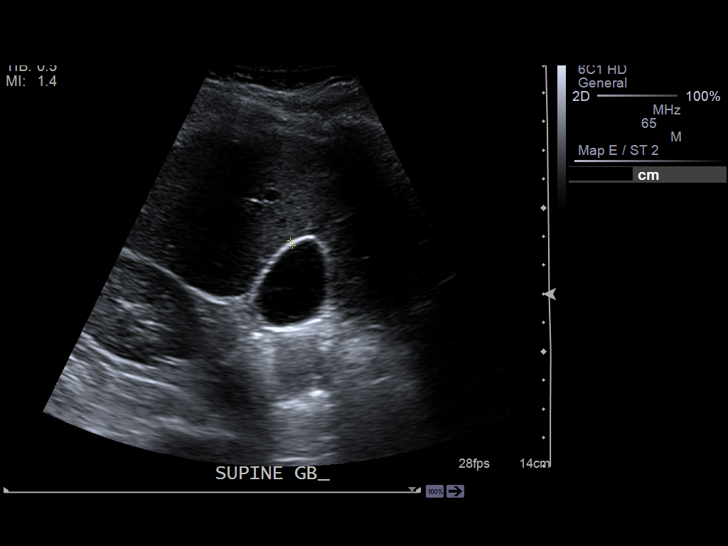
[im 11/23]
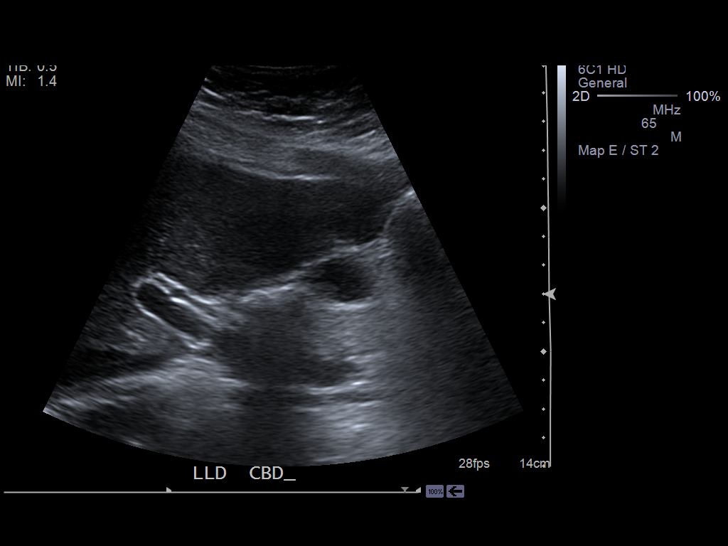
[im 13/23]
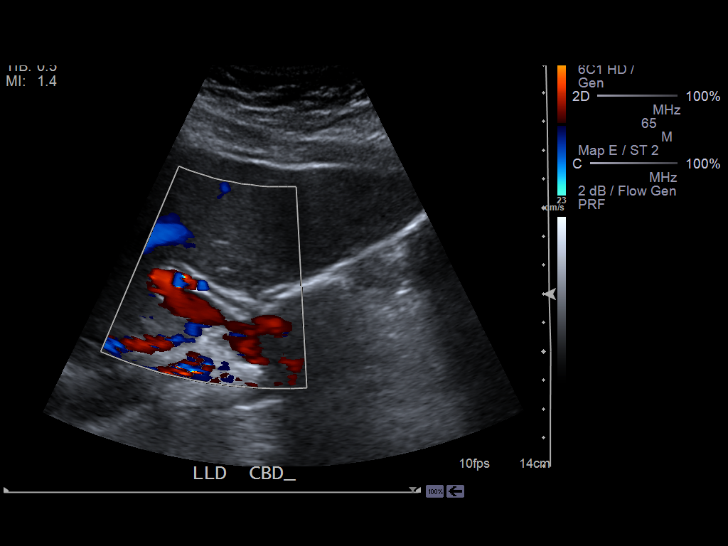
[im 14/23]
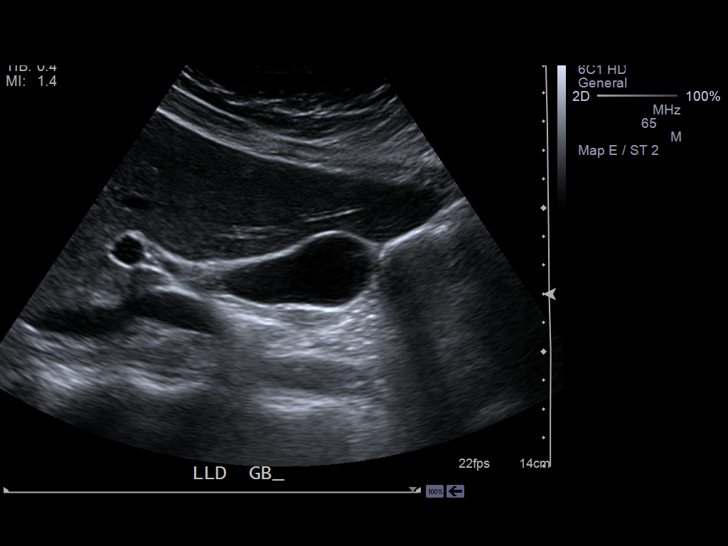
[im 16/23]
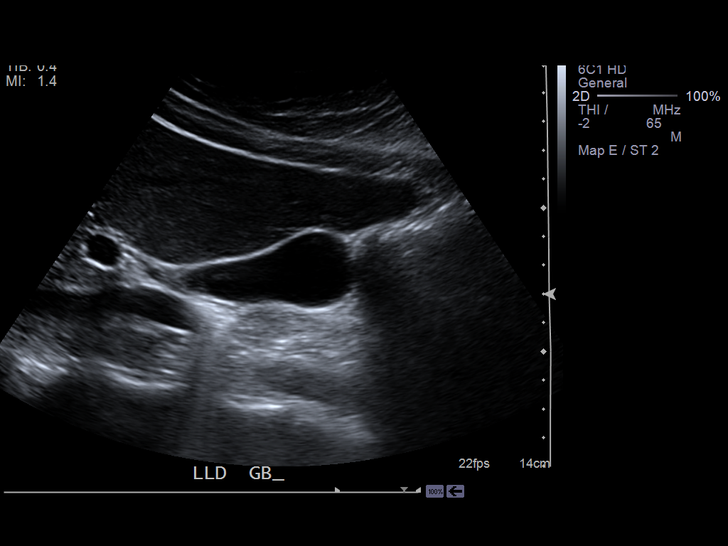
[im 18/23]
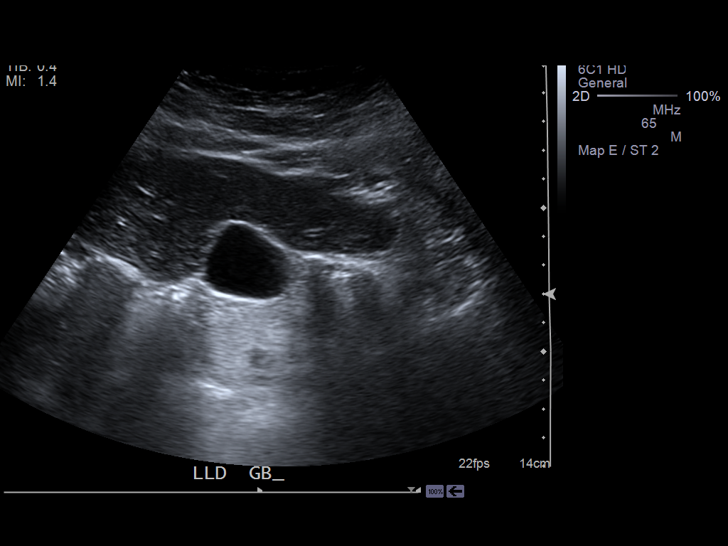
[im 19/23]
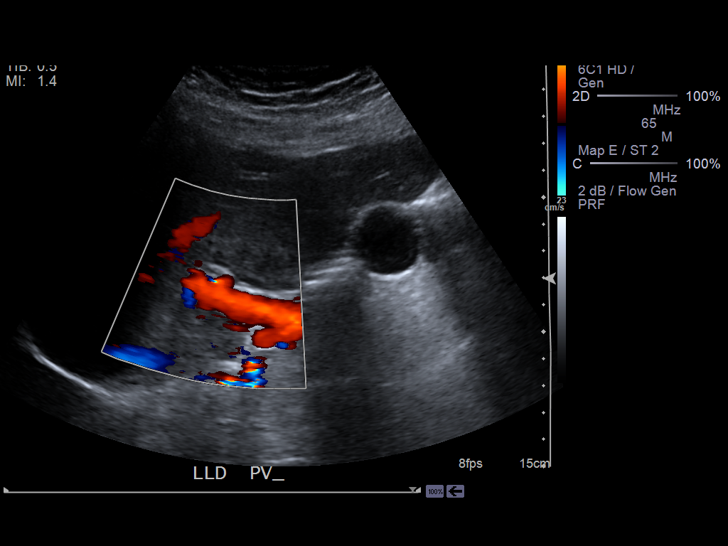
[im 21/23]
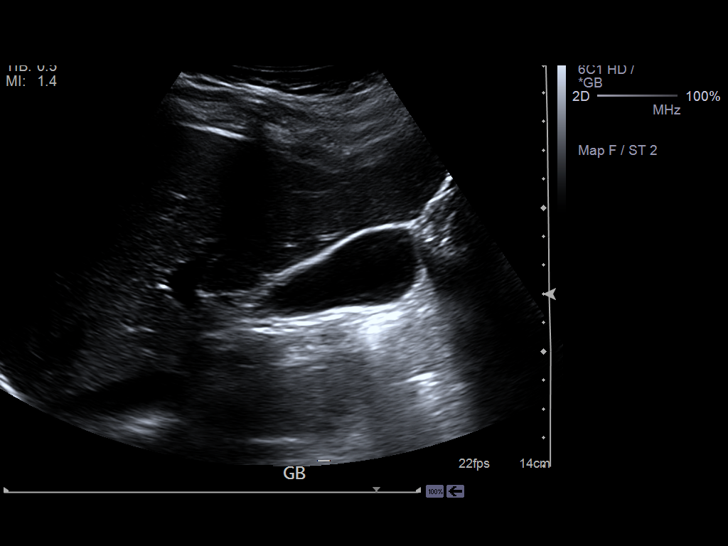
[im 23/23]
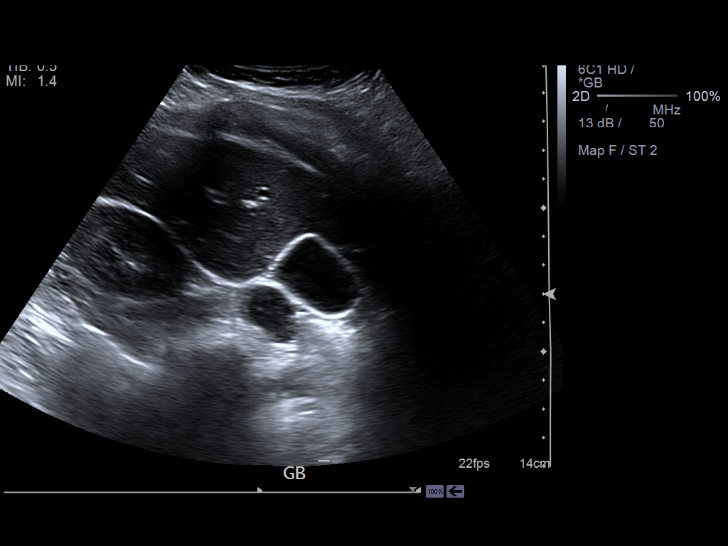

[14 of 23 positions shown; findings below may reference images not displayed]

PROCEDURE:     US  - US ABDOMEN LIMITED SURVEY  - [DATE]  [DATE]

RESULT:     The patient has a previous abdominal sonogram dated [DATE]. Limited right upper quadrant abdominal sonogram is performed. The
pancreas appears unremarkable. The gallbladder wall measures 1.9 mm. The
common bile duct diameter is 3.0 mm. There is no sonographic Murphy's sign
or pericholecystic fluid. No cholelithiasis is evident. There is no ascites
or abnormal fluid collection. Portal venous flow is normal.
IMPRESSION: 1. Normal appearing limited right upper quadrant abdominal sonogram.

[REDACTED]

## 2012-10-07 ENCOUNTER — Ambulatory Visit: Payer: Self-pay | Admitting: Obstetrics and Gynecology

## 2012-11-27 ENCOUNTER — Inpatient Hospital Stay: Payer: Self-pay | Admitting: Obstetrics and Gynecology

## 2012-11-27 LAB — CBC WITH DIFFERENTIAL/PLATELET
Basophil %: 0.7 %
HGB: 12.8 g/dL (ref 12.0–16.0)
MCHC: 35.3 g/dL (ref 32.0–36.0)
MCV: 89 fL (ref 80–100)
Monocyte %: 8.6 %
Neutrophil %: 74.5 %
WBC: 10.7 10*3/uL (ref 3.6–11.0)

## 2012-11-27 LAB — GC/CHLAMYDIA PROBE AMP

## 2015-02-14 ENCOUNTER — Encounter: Payer: Self-pay | Admitting: Certified Nurse Midwife

## 2015-02-20 ENCOUNTER — Encounter: Payer: Self-pay | Admitting: Certified Nurse Midwife

## 2015-03-15 ENCOUNTER — Encounter: Payer: Self-pay | Admitting: Obstetrics and Gynecology

## 2015-03-15 ENCOUNTER — Ambulatory Visit (INDEPENDENT_AMBULATORY_CARE_PROVIDER_SITE_OTHER): Payer: Managed Care, Other (non HMO) | Admitting: Obstetrics and Gynecology

## 2015-03-15 VITALS — BP 121/85 | HR 91 | Ht 64.0 in | Wt 199.6 lb

## 2015-03-15 DIAGNOSIS — E669 Obesity, unspecified: Secondary | ICD-10-CM | POA: Diagnosis not present

## 2015-03-15 DIAGNOSIS — N946 Dysmenorrhea, unspecified: Secondary | ICD-10-CM

## 2015-03-15 DIAGNOSIS — Z01419 Encounter for gynecological examination (general) (routine) without abnormal findings: Secondary | ICD-10-CM

## 2015-03-15 DIAGNOSIS — N920 Excessive and frequent menstruation with regular cycle: Secondary | ICD-10-CM | POA: Diagnosis not present

## 2015-03-15 MED ORDER — TRANEXAMIC ACID 650 MG PO TABS: 1300.0000 mg | ORAL_TABLET | Freq: Three times a day (TID) | ORAL | Status: DC

## 2015-03-15 NOTE — Progress Notes (Signed)
  Subjective:     Alicia Howard is a 25 y.o. female and is here for a comprehensive physical exam. The patient reports regular but heavy menses since dlivery of son 2 years ago.  Social History   Social History  . Marital Status: Married    Spouse Name: N/A  . Number of Children: N/A  . Years of Education: N/A   Occupational History  . Not on file.   Social History Main Topics  . Smoking status: Never Smoker   . Smokeless tobacco: Never Used  . Alcohol Use: No  . Drug Use: No  . Sexual Activity: Yes    Birth Control/ Protection: Coitus interruptus   Other Topics Concern  . Not on file   Social History Narrative  . No narrative on file   Health Maintenance  Topic Date Due  . HIV Screening  07/03/2003  . TETANUS/TDAP  07/03/2007  . PAP SMEAR  07/02/2009  . INFLUENZA VACCINE  09/12/2014    The following portions of the patient's history were reviewed and updated as appropriate: allergies, current medications, past family history, past medical history, past social history, past surgical history and problem list.  Review of Systems Pertinent items noted in HPI and remainder of comprehensive ROS otherwise negative.   Objective:    General appearance: alert, cooperative, appears stated age and moderately obese Neck: no adenopathy, no carotid bruit, no JVD, supple, symmetrical, trachea midline and thyroid not enlarged, symmetric, no tenderness/mass/nodules Lungs: clear to auscultation bilaterally Breasts: normal appearance, no masses or tenderness Heart: regular rate and rhythm, S1, S2 normal, no murmur, click, rub or gallop Abdomen: soft, non-tender; bowel sounds normal; no masses,  no organomegaly Pelvic: cervix normal in appearance, external genitalia normal, no adnexal masses or tenderness, no cervical motion tenderness, rectovaginal septum normal, uterus normal size, shape, and consistency and vagina normal without discharge    Assessment:    Healthy female exam.  Obesity; Menorrhagia; Migraines;dysmenorrhea      Plan:  Labs and pap obtained Offered lysteda and RX sent in. RTC 1 year of prn. OK to do weight loss here if desires.   See After Visit Summary for Counseling Recommendations

## 2015-03-15 NOTE — Patient Instructions (Addendum)
Place annual gynecologic exam patient instructions here.  Thank you for enrolling in MyChart. Please follow the instructions below to securely access your online medical record. MyChart allows you to send messages to your doctor, view your test results, manage appointments, and more.   How Do I Sign Up? 1. In your Internet browser, go to [employer] and enter [email or web address]. 2. Click on the Sign Up Now link in the Sign In box. You will see the New Member Sign Up page. 3. Enter your MyChart Access Code exactly as it appears below. You will not need to use this code after you've completed the sign-up process. If you do not sign up before the expiration date, you must request a new code.  MyChart Access Code: [email or web address] Expires: 05/14/2015  4:20 PM  4. Enter your Social Security Number ([id number]) and Date of Birth (mm/dd/yyyy) as indicated and click Submit. You will be taken to the next sign-up page. 5. Create a MyChart ID. This will be your MyChart login ID and cannot be changed, so think of one that is secure and easy to remember. 6. Create a MyChart password. You can change your password at any time. 7. Enter your Password Reset Question and Answer. This can be used at a later time if you forget your password.  8. Enter your e-mail address. You will receive e-mail notification when new information is available in MyChart. 9. Click Sign Up. You can now view your medical record.   Additional Information Remember, MyChart is NOT to be used for urgent needs. For medical emergencies, dial 911.   Tranexamic acid oral tablets What is this medicine? TRANEXAMIC ACID (TRAN ex AM ik AS id) slows down or stops blood clots from being broken down. This medicine is used to treat heavy monthly menstrual bleeding. This medicine may be used for other purposes; ask your health care provider or pharmacist if you have questions. What should I tell my health care provider before I  take this medicine? They need to know if you have any of these conditions: -bleeding in the brain -blood clotting problems -kidney disease -vision problems -an unusual allergic reaction to tranexamic acid, other medicines, foods, dyes, or preservatives -pregnant or trying to get pregnant -breast-feeding How should I use this medicine? Take this medicine by mouth with a glass of water. Follow the directions on the prescription label. Do not cut, crush, or chew this medicine. You can take it with or without food. If it upsets your stomach, take it with food. Take your medicine at regular intervals. Do not take it more often than directed. Do not stop taking except on your doctor's advice. Do not take this medicine until your period has started. Do not take it for more than 5 days in a row. Do not take this medicine when you do not have your period. Talk to your pediatrician regarding the use of this medicine in children. While this drug may be prescribed for female children as young as 34 years of age for selected conditions, precautions do apply. Overdosage: If you think you have taken too much of this medicine contact a poison control center or emergency room at once. NOTE: This medicine is only for you. Do not share this medicine with others. What if I miss a dose? If you miss a dose, take it when you remember, and then take your next dose at least 6 hours later. Do not take more than 2 tablets at a time to make up  for missed doses. What may interact with this medicine? Do not take this medicine with any of the following medications: -estrogens -birth control pills, patches, injections, rings or other devices that contain both an estrogen and a progestin This medicine may also interact with the following medications: -certain medicines used to help your blood clot -tretinoin (taken by mouth) This list may not describe all possible interactions. Give your health care provider a list of all  the medicines, herbs, non-prescription drugs, or dietary supplements you use. Also tell them if you smoke, drink alcohol, or use illegal drugs. Some items may interact with your medicine. What should I watch for while using this medicine? Tell your doctor or healthcare professional if your symptoms do not start to get better or if they get worse. Tell your doctor or healthcare professional if you notice any eye problems while taking this medicine. Your doctor will refer you to an eye doctor who will examine your eyes. What side effects may I notice from receiving this medicine? Side effects that you should report to your doctor or health care professional as soon as possible: -allergic reactions like skin rash, itching or hives, swelling of the face, lips, or tongue -breathing difficulties -changes in vision -sudden or severe pain in the chest, legs, head, or groin -unusually weak or tired Side effects that usually do not require medical attention (Report these to your doctor or health care professional if they continue or are bothersome.): -back pain -headache -muscle or joint aches -sinus and nasal problems -stomach pain -tiredness This list may not describe all possible side effects. Call your doctor for medical advice about side effects. You may report side effects to FDA at 1-800-FDA-1088. Where should I keep my medicine? Keep out of the reach of children. Store at room temperature between 15 and 30 degrees C (59 and 86 degrees F). Throw away any unused medicine after the expiration date. NOTE: This sheet is a summary. It may not cover all possible information. If you have questions about this medicine, talk to your doctor, pharmacist, or health care provider.    2016, [employer]vier/[employer]. (2013-07-22 14:14:28)

## 2015-03-16 ENCOUNTER — Other Ambulatory Visit: Payer: Self-pay | Admitting: Obstetrics and Gynecology

## 2015-03-16 DIAGNOSIS — E559 Vitamin D deficiency, unspecified: Secondary | ICD-10-CM | POA: Insufficient documentation

## 2015-03-16 LAB — COMPREHENSIVE METABOLIC PANEL
ALBUMIN: 4.7 g/dL (ref 3.5–5.5)
ALT: 8 IU/L (ref 0–32)
AST: 15 IU/L (ref 0–40)
Albumin/Globulin Ratio: 1.8 (ref 1.1–2.5)
Alkaline Phosphatase: 72 IU/L (ref 39–117)
BILIRUBIN TOTAL: 0.3 mg/dL (ref 0.0–1.2)
BUN / CREAT RATIO: 18 (ref 8–20)
BUN: 12 mg/dL (ref 6–20)
CO2: 21 mmol/L (ref 18–29)
CREATININE: 0.66 mg/dL (ref 0.57–1.00)
Calcium: 9.6 mg/dL (ref 8.7–10.2)
Chloride: 99 mmol/L (ref 96–106)
GFR calc non Af Amer: 122 mL/min/{1.73_m2} (ref >59)
GFR, EST AFRICAN AMERICAN: 141 mL/min/{1.73_m2} (ref >59)
GLOBULIN, TOTAL: 2.6 g/dL (ref 1.5–4.5)
GLUCOSE: 78 mg/dL (ref 65–99)
Potassium: 4.5 mmol/L (ref 3.5–5.2)
SODIUM: 136 mmol/L (ref 134–144)
TOTAL PROTEIN: 7.3 g/dL (ref 6.0–8.5)

## 2015-03-16 LAB — CBC
HEMATOCRIT: 41 % (ref 34.0–46.6)
HEMOGLOBIN: 13.7 g/dL (ref 11.1–15.9)
MCH: 29.8 pg (ref 26.6–33.0)
MCHC: 33.4 g/dL (ref 31.5–35.7)
MCV: 89 fL (ref 79–97)
Platelets: 275 10*3/uL (ref 150–379)
RBC: 4.59 x10E6/uL (ref 3.77–5.28)
RDW: 13.8 % (ref 12.3–15.4)
WBC: 12.2 10*3/uL — ABNORMAL HIGH (ref 3.4–10.8)

## 2015-03-16 LAB — THYROID PANEL WITH TSH
Free Thyroxine Index: 2.4 (ref 1.2–4.9)
T3 UPTAKE RATIO: 26 % (ref 24–39)
T4, Total: 9.1 ug/dL (ref 4.5–12.0)
TSH: 3.25 u[IU]/mL (ref 0.450–4.500)

## 2015-03-16 LAB — HEMOGLOBIN A1C
Est. average glucose Bld gHb Est-mCnc: 103 mg/dL
Hgb A1c MFr Bld: 5.2 % (ref 4.8–5.6)

## 2015-03-16 LAB — IRON: Iron: 48 ug/dL (ref 27–159)

## 2015-03-16 LAB — VITAMIN D 25 HYDROXY (VIT D DEFICIENCY, FRACTURES): Vit D, 25-Hydroxy: 18 ng/mL — ABNORMAL LOW (ref 30.0–100.0)

## 2015-03-16 MED ORDER — VITAMIN D (ERGOCALCIFEROL) 1.25 MG (50000 UNIT) PO CAPS: 50000.0000 [IU] | ORAL_CAPSULE | ORAL | Status: DC

## 2015-03-17 LAB — CYTOLOGY - PAP

## 2015-03-27 ENCOUNTER — Telehealth: Payer: Self-pay | Admitting: Obstetrics and Gynecology

## 2015-03-27 ENCOUNTER — Other Ambulatory Visit: Payer: Self-pay | Admitting: Obstetrics and Gynecology

## 2015-03-27 MED ORDER — CYANOCOBALAMIN 1000 MCG/ML IJ SOLN: 1000.0000 ug | Freq: Once | INTRAMUSCULAR | Status: DC

## 2015-03-27 NOTE — Telephone Encounter (Signed)
Pt is coming Monday for b12 inj will you send rx to [facility] dr.

## 2015-04-03 ENCOUNTER — Ambulatory Visit (INDEPENDENT_AMBULATORY_CARE_PROVIDER_SITE_OTHER): Payer: Managed Care, Other (non HMO) | Admitting: Obstetrics and Gynecology

## 2015-04-03 VITALS — BP 118/81 | HR 88 | Wt 203.2 lb

## 2015-04-03 DIAGNOSIS — E669 Obesity, unspecified: Secondary | ICD-10-CM

## 2015-04-03 MED ORDER — CYANOCOBALAMIN 1000 MCG/ML IJ SOLN: 1000.0000 ug | Freq: Once | INTRAMUSCULAR | Status: DC

## 2015-04-03 NOTE — Progress Notes (Cosign Needed)
Pt is her for her b-12 inj She had already gotten her phentermine rx from Dr. Dareen Piano

## 2015-05-01 ENCOUNTER — Ambulatory Visit (INDEPENDENT_AMBULATORY_CARE_PROVIDER_SITE_OTHER): Payer: Managed Care, Other (non HMO) | Admitting: Obstetrics and Gynecology

## 2015-05-01 VITALS — BP 112/78 | HR 101 | Ht 64.0 in | Wt 203.0 lb

## 2015-05-01 DIAGNOSIS — E669 Obesity, unspecified: Secondary | ICD-10-CM

## 2015-05-01 MED ORDER — CYANOCOBALAMIN 1000 MCG/ML IJ SOLN
1000.0000 ug | Freq: Once | INTRAMUSCULAR | Status: AC
  Administered 2015-05-01: 1000 ug via INTRAMUSCULAR

## 2015-05-01 NOTE — Progress Notes (Signed)
Patient ID: Alicia CreaseSarah D Howard, female   DOB: 09/04/1990, 25 y.o.   MRN: [id number] Pt presents for wt, bp and b12 inj. NO change in weight. NO s/e from adipex.

## 2015-05-29 ENCOUNTER — Ambulatory Visit (INDEPENDENT_AMBULATORY_CARE_PROVIDER_SITE_OTHER): Payer: Managed Care, Other (non HMO) | Admitting: Obstetrics and Gynecology

## 2015-05-29 VITALS — BP 111/77 | HR 94 | Ht 64.0 in | Wt 201.2 lb

## 2015-05-29 DIAGNOSIS — E669 Obesity, unspecified: Secondary | ICD-10-CM | POA: Diagnosis not present

## 2015-05-29 MED ORDER — CYANOCOBALAMIN 1000 MCG/ML IJ SOLN
1000.0000 ug | Freq: Once | INTRAMUSCULAR | Status: AC
  Administered 2015-05-29: 1000 ug via INTRAMUSCULAR

## 2015-05-29 NOTE — Progress Notes (Signed)
Patient ID: Alicia CreaseSarah D Melberg, female   DOB: 25-Aug-1990, 25 y.o.   MRN: [id number]   Pt presents for wt, bp, and b12 inj. NO s/e from medication. Pt is down 2# from last visit. Will need to to see MNS next visit.

## 2015-07-07 ENCOUNTER — Encounter: Payer: Self-pay | Admitting: Obstetrics and Gynecology

## 2015-07-07 ENCOUNTER — Ambulatory Visit (INDEPENDENT_AMBULATORY_CARE_PROVIDER_SITE_OTHER): Payer: Managed Care, Other (non HMO) | Admitting: Obstetrics and Gynecology

## 2015-07-07 VITALS — BP 119/84 | HR 96 | Wt 204.9 lb

## 2015-07-07 DIAGNOSIS — Z79899 Other long term (current) drug therapy: Secondary | ICD-10-CM | POA: Diagnosis not present

## 2015-07-07 MED ORDER — CYANOCOBALAMIN 1000 MCG/ML IJ SOLN: 1000.0000 ug | Freq: Once | INTRAMUSCULAR | Status: DC

## 2015-07-07 MED ORDER — PHENTERMINE HCL 37.5 MG PO TABS: 37.5000 mg | ORAL_TABLET | Freq: Every day | ORAL | Status: DC

## 2015-07-07 NOTE — Progress Notes (Signed)
SUBJECTIVE:  25 y.o. here for follow-up weight loss visit, previously seen 4 weeks ago. Denies any concerns and feels like medication worked well at first, but hasn't been taking it consistently now, is not exercising due to URI, and not drinking water. Doesn't feel like her head is in the right place.  OBJECTIVE:  BP 119/84 mmHg  Pulse 96  Wt 204 lb 14.4 oz (92.942 kg)  LMP 07/03/2015  Body mass index is 35.15 kg/(m^2). Patient appears well. No weight loss to date. ASSESSMENT:  Obesity  PLAN:  To finish last week of phentermine and then take a 3-4 weeks break, OK to restart then if ready to commit to exercise and water intake . B12 102200mcg/ml injection given RTC in 4 weeks after restarting meds.  Melody RenoShambley, CNM

## 2015-12-27 ENCOUNTER — Ambulatory Visit (INDEPENDENT_AMBULATORY_CARE_PROVIDER_SITE_OTHER): Payer: Managed Care, Other (non HMO) | Admitting: Obstetrics and Gynecology

## 2015-12-27 ENCOUNTER — Encounter: Payer: Self-pay | Admitting: Obstetrics and Gynecology

## 2015-12-27 VITALS — BP 129/80 | HR 90 | Ht 64.0 in | Wt 218.9 lb

## 2015-12-27 DIAGNOSIS — N926 Irregular menstruation, unspecified: Secondary | ICD-10-CM | POA: Diagnosis not present

## 2015-12-27 DIAGNOSIS — Z3490 Encounter for supervision of normal pregnancy, unspecified, unspecified trimester: Secondary | ICD-10-CM

## 2015-12-27 DIAGNOSIS — O219 Vomiting of pregnancy, unspecified: Secondary | ICD-10-CM | POA: Diagnosis not present

## 2015-12-27 DIAGNOSIS — Z3491 Encounter for supervision of normal pregnancy, unspecified, first trimester: Secondary | ICD-10-CM

## 2015-12-27 LAB — POCT URINE PREGNANCY: Preg Test, Ur: POSITIVE — AB

## 2015-12-27 MED ORDER — ONDANSETRON 4 MG PO TBDP: 4.0000 mg | ORAL_TABLET | Freq: Four times a day (QID) | ORAL | 0 refills | Status: DC | PRN

## 2015-12-27 NOTE — Progress Notes (Signed)
Subjective:     Patient ID: Gilda CreaseSarah D Skillin, female   DOB: 26-Dec-1990, 25 y.o.   MRN: [id number]  HPI  G2P1, Recently had positive pregnancy test 10/30. Has been having nausea and vomiting daily since Nov. 4th and 5th. Has 27 year old at home and had nausea and vomiting with that pregnancy for the first 5 months.   Review of Systems  All negative except what is mentioned above in HPI.      Objective:   Physical Exam A&O x4 Resp even and unlabored. Heart sounds present with normal rate and rhythm Blood pressure 129/80, pulse 90, height 5\' 4"  (1.626 m), weight 218 lb 14.4 oz (99.3 kg), last menstrual period 11/11/2015 UPT+    Assessment:     Nausea and vomiting during early pregnacy Pregnancy first trimester    Plan:     Samples of Diclegis given Zofran prescription sent in. Will schedule nurse visit and viability US in 2 weeks. Follow up with [facility] at 12 weeks for routine OB visit.  Smiley HousemanMelissa Labrian Torregrossa, RN, Student FNP    I assessed and examined patient with student and agree with plan of care.  Melody OnwardShambley, CNM

## 2016-01-09 ENCOUNTER — Ambulatory Visit (INDEPENDENT_AMBULATORY_CARE_PROVIDER_SITE_OTHER): Payer: Managed Care, Other (non HMO) | Admitting: Obstetrics and Gynecology

## 2016-01-09 ENCOUNTER — Encounter: Payer: Self-pay | Admitting: Obstetrics and Gynecology

## 2016-01-09 ENCOUNTER — Ambulatory Visit (INDEPENDENT_AMBULATORY_CARE_PROVIDER_SITE_OTHER): Payer: Managed Care, Other (non HMO)

## 2016-01-09 VITALS — BP 117/71 | HR 91 | Ht 64.0 in | Wt 217.3 lb

## 2016-01-09 DIAGNOSIS — O021 Missed abortion: Secondary | ICD-10-CM | POA: Diagnosis not present

## 2016-01-09 DIAGNOSIS — N926 Irregular menstruation, unspecified: Secondary | ICD-10-CM

## 2016-01-09 NOTE — Patient Instructions (Signed)
1. Suction D&C is scheduled for 01/15/2016 2. Patient is to return in 1 week after surgery for postop check 3. Patient is to return sooner if bleeding and cramping develop

## 2016-01-09 NOTE — Progress Notes (Signed)
GYN ENCOUNTER NOTE  Subjective:       Alicia Howard is a 25 y.o. G2P 100 1 female is here for gynecologic evaluation of the following issues:  1. Ultrasound follow-up  Ultrasound today demonstrates missed abortion; 8.4 week intrauterine pregnancy without fetal cardiac activity is noted. Patient is without vaginal bleeding or cramping. Patient continues to have severe nausea with vomiting.   Gynecologic History Patient's last menstrual period was 11/11/2015.  Obstetric History OB History  Gravida Para Term Preterm AB Living  2         1  SAB TAB Ectopic Multiple Live Births          1    # Outcome Date GA Lbr Len/2nd Weight Sex Delivery Anes PTL Lv  2 Current           1 Gravida 2014    M Vag-Spont   LIV      Past Medical History:  Diagnosis Date  . Dysmenorrhea   . Migraines   . Obesity     Past Surgical History:  Procedure Laterality Date  . none      Current Outpatient Prescriptions on File Prior to Visit  Medication Sig Dispense Refill  . ondansetron (ZOFRAN ODT) 4 MG disintegrating tablet Take 1 tablet (4 mg total) by mouth every 6 (six) hours as needed for nausea. 20 tablet 0  . Prenatal Vit-Fe Fumarate-FA (PRENATAL MULTIVITAMIN) TABS tablet Take 1 tablet by mouth daily at 12 noon.     No current facility-administered medications on file prior to visit.     Allergies  Allergen Reactions  . Latex Dermatitis    Social History   Social History  . Marital status: Married    Spouse name: N/A  . Number of children: N/A  . Years of education: N/A   Occupational History  . Not on file.   Social History Main Topics  . Smoking status: Never Smoker  . Smokeless tobacco: Never Used  . Alcohol use No  . Drug use: No  . Sexual activity: Yes    Birth control/ protection: Coitus interruptus   Other Topics Concern  . Not on file   Social History Narrative  . No narrative on file    Family History  Problem Relation Age of Onset  . Hypertension  Father   . Diabetes Maternal Grandmother   . Stroke Maternal Grandfather   . Cancer Paternal Grandfather     prostate    The following portions of the patient's history were reviewed and updated as appropriate: allergies, current medications, past family history, past medical history, past social history, past surgical history and problem list.  Review of Systems Review of Systems -positive for nausea and vomiting; negative for pelvic pain, vaginal bleeding, vaginal discharge.  Objective:   BP 117/71   Pulse 91   Ht 5\' 4"  (1.626 m)   Wt 217 lb 4.8 oz (98.6 kg)   LMP 11/11/2015   BMI 37.30 kg/m  CONSTITUTIONAL: Well-developed, well-nourished female in no acute distress. Tearful. Physical exam-deferred    Assessment:   1. Missed abortion     Plan:   1. Suction D&C to be scheduled for 01/15/2016 2. Preoperative history and physical is completed today; patient will be contacted regarding anesthesia preop.  A total of 25 minutes were spent face-to-face with the patient during this encounter and over half of that time involved counseling and coordination of care.  Herold HarmsMartin A Lysle Yero, MD  Note: This dictation was prepared  with Dragon dictation along with smaller phrase technology. Any transcriptional errors that result from this process are unintentional.

## 2016-01-09 NOTE — H&P (Signed)
Subjective:  PREOPERATIVE HISTORY AND PHYSICAL     Patient is a 25 y.o. G2P1001 620female scheduled for Surgery on 01/15/2016  Planned procedure: Suction D&C  Patient presents with missed abortion at 8.[redacted] weeks gestation. She is not experiencing any vaginal bleeding or pelvic pain.  Pelvic ultrasound 01/09/2016: Impression: 1. 8 5/7 week NON Viable Singleton Intrauterine pregnancy by U/S. 2. (U/S) EDD is consistent with Clinically established (LMP) EDD of 08/17/16. 3. No fetal heart beat detected.     Menstrual History: OB History    Gravida Para Term Preterm AB Living   2         1   SAB TAB Ectopic Multiple Live Births           1      Menarche age: NA Patient's last menstrual period was 11/11/2015.    Past Medical History:  Diagnosis Date  . Dysmenorrhea   . Migraines   . Obesity     Past Surgical History:  Procedure Laterality Date  . none      OB History  Gravida Para Term Preterm AB Living  2         1  SAB TAB Ectopic Multiple Live Births          1    # Outcome Date GA Lbr Len/2nd Weight Sex Delivery Anes PTL Lv  2 Current           1 Gravida 2014    M Vag-Spont   LIV      Social History   Social History  . Marital status: Married    Spouse name: N/A  . Number of children: N/A  . Years of education: N/A   Social History Main Topics  . Smoking status: Never Smoker  . Smokeless tobacco: Never Used  . Alcohol use No  . Drug use: No  . Sexual activity: Yes    Birth control/ protection: Coitus interruptus   Other Topics Concern  . None   Social History Narrative  . None    Family History  Problem Relation Age of Onset  . Hypertension Father   . Diabetes Maternal Grandmother   . Stroke Maternal Grandfather   . Cancer Paternal Grandfather     prostate     (Not in a hospital admission)  Allergies  Allergen Reactions  . Latex Dermatitis    Review of Systems Constitutional: No recent fever/chills/sweats Respiratory: No recent  cough/bronchitis Cardiovascular: No chest pain Gastrointestinal: No recent nausea/vomiting/diarrhea Genitourinary: No UTI symptoms Hematologic/lymphatic:No history of coagulopathy or recent blood thinner use    Objective:    BP 117/71   Pulse 91   Ht 5\' 4"  (1.626 m)   Wt 217 lb 4.8 oz (98.6 kg)   LMP 11/11/2015   BMI 37.30 kg/m   General:   Normal  Skin:   normal  HEENT:  Normal  Neck:  Supple without Adenopathy or Thyromegaly  Lungs:   Heart:              Breasts:   Abdomen:  Pelvis:  M/S   Extremeties:  Neuro:    clear to auscultation bilaterally   Normal without murmur   Not Examined   soft, non-tender; bowel sounds normal; no masses,  no organomegaly   Exam deferred to OR  No CVAT  Warm/Dry   Normal          Assessment:    1. Missed abortion   Plan:  Suction D&C   Preop  counseling: Patient is to undergo suction D&C for missed AB on 01/15/2016. She is understanding of the planned procedures and is aware of and is accepting of all surgical risks which include but are not limited to bleeding, infection, pelvic organ injury with need for repair, blood clot disorders, anesthesia risks, etc. All questions have been answered. Informed consent is given. Patient is ready and willing to proceed with surgery as scheduled.  Herold HarmsMartin A Defrancesco, MD  Note: This dictation was prepared with Dragon dictation along with smaller phrase technology. Any transcriptional errors that result from this process are unintentional.

## 2016-01-10 ENCOUNTER — Telehealth: Payer: Self-pay | Admitting: Obstetrics and Gynecology

## 2016-01-10 MED ORDER — MISOPROSTOL 200 MCG PO TABS: 800.0000 ug | ORAL_TABLET | Freq: Once | ORAL | 0 refills | Status: DC

## 2016-01-10 NOTE — Telephone Encounter (Signed)
Pt aware per mad ok to erx cytotec 800mcg one time dose. Pt is to take med on Saturday. F/u on Tuesday for u/s and see mad after. U/s appt made for 12/5 at 8am. TB to make f/u with mad at 8:30.

## 2016-01-10 NOTE — Telephone Encounter (Signed)
Patient husband called. They have decided to go with taking the medicine instead of surgery. Please Advise. [facility] [address]Thanks

## 2016-01-12 ENCOUNTER — Other Ambulatory Visit: Payer: Self-pay | Admitting: Obstetrics and Gynecology

## 2016-01-12 DIAGNOSIS — O021 Missed abortion: Secondary | ICD-10-CM

## 2016-01-15 ENCOUNTER — Ambulatory Visit
Admission: RE | Admit: 2016-01-15 | Payer: Managed Care, Other (non HMO) | Source: Ambulatory Visit | Admitting: Obstetrics and Gynecology

## 2016-01-15 ENCOUNTER — Encounter: Admission: RE | Payer: Self-pay | Source: Ambulatory Visit

## 2016-01-15 SURGERY — DILATION AND EVACUATION, UTERUS
Anesthesia: General

## 2016-01-16 ENCOUNTER — Ambulatory Visit (INDEPENDENT_AMBULATORY_CARE_PROVIDER_SITE_OTHER): Payer: Managed Care, Other (non HMO)

## 2016-01-16 ENCOUNTER — Encounter: Payer: Self-pay | Admitting: Obstetrics and Gynecology

## 2016-01-16 ENCOUNTER — Ambulatory Visit (INDEPENDENT_AMBULATORY_CARE_PROVIDER_SITE_OTHER): Payer: Managed Care, Other (non HMO) | Admitting: Obstetrics and Gynecology

## 2016-01-16 VITALS — BP 114/78 | HR 118 | Ht 64.0 in | Wt 215.3 lb

## 2016-01-16 DIAGNOSIS — O021 Missed abortion: Secondary | ICD-10-CM

## 2016-01-16 NOTE — Patient Instructions (Signed)
1. Quantitative hCG blood test today 2. Obtain weekly hCG blood tests until less than 5 mL Morrie Sheldonshley is (negative) 3. Wait for 1 normal cycle before trying to conceive 4. Continue taking prenatal vitamins

## 2016-01-16 NOTE — Progress Notes (Signed)
Chief complaint: 1. Follow-up on ULTRASOUND 2. Status post Cytotec treatment for missed AB  Patient took Cytotec over the weekend and passed tissue 3 days ago. She is having light spotting at this time. No significant pelvic pain. During the passage of tissue she took Motrin for pain relief. If it did appear to her that she passed the entire sac; she did not bring the tissue in for assessment.  No current pelvic pain.  Ultrasound: Summary-no evidence of POC within the uterus; endometrium measures 17.4 mm OBJECTIVE: BP 114/78   Pulse (!) 118   Ht 5\' 4"  (1.626 m)   Wt 215 lb 4.8 oz (97.7 kg)   LMP 11/11/2015   BMI 36.96 kg/m  Pleasant female in no acute distress Abdomen: Soft, nontender Pelvic exam: External genitalia-normal BUS-normal Vagina-minimal blood in vault Cervix-closed; soft, without cervical motion tenderness Uterus-midplane, normal size and shape, nontender Adnexa-nonpalpable and nontender  ASSESSMENT: 1. Missed abortion 2. Status post Cytotec therapy-successful  PLAN: 1. Quantitative hCG today and weekly until titer is less than 5 milli international units 2. Continue taking prenatal vitamins 3. Avoid conception for at least 1 or 2 normal cycles post miscarriage 4. Return to see Harlow MaresMelody Shambley for annual exam in February 2018  A total of 15 minutes were spent face-to-face with the patient during this encounter and over half of that time dealt with counseling and coordination of care.  Herold HarmsMartin A Kwabena Strutz, MD  Note: This dictation was prepared with Dragon dictation along with smaller phrase technology. Any transcriptional errors that result from this process are unintentional.

## 2016-01-17 LAB — BETA HCG QUANT (REF LAB): HCG QUANT: 13836 m[IU]/mL

## 2016-01-19 ENCOUNTER — Telehealth: Payer: Self-pay

## 2016-01-19 DIAGNOSIS — O021 Missed abortion: Secondary | ICD-10-CM

## 2016-01-19 NOTE — Telephone Encounter (Signed)
-----   Message from Herold HarmsMartin A Defrancesco, MD sent at 01/19/2016  1:08 PM EST ----- Please notify - Abnormal Labs Repeat quantitative hCG test in 1 week

## 2016-01-19 NOTE — Telephone Encounter (Signed)
Pt aware. Lab ordered for Monday 12/11 d/t pt  off work that day. On lab schedule.

## 2016-01-22 ENCOUNTER — Other Ambulatory Visit: Payer: Managed Care, Other (non HMO)

## 2016-01-22 ENCOUNTER — Other Ambulatory Visit: Payer: Self-pay | Admitting: Obstetrics and Gynecology

## 2016-01-23 LAB — BETA HCG QUANT (REF LAB): HCG QUANT: 824 m[IU]/mL

## 2016-01-26 ENCOUNTER — Telehealth: Payer: Self-pay

## 2016-01-26 DIAGNOSIS — O021 Missed abortion: Secondary | ICD-10-CM

## 2016-01-26 NOTE — Telephone Encounter (Signed)
Pt aware to repeat beta in 2 weeks. That falls on 12/25. Pt will have it drawn on 12/22. Pt aware she will have to be drawn at pt service center. Lab slip printed and let up front for p/u.

## 2016-01-26 NOTE — Telephone Encounter (Signed)
-----   Message from Herold HarmsMartin A Defrancesco, MD sent at 01/26/2016 11:25 AM EST ----- Please notify - Abnormal Labs Please repeat quantitative hCG in 2 weeks

## 2016-02-03 LAB — BETA HCG QUANT (REF LAB): hCG Quant: 39 m[IU]/mL

## 2016-02-07 ENCOUNTER — Encounter: Payer: Managed Care, Other (non HMO) | Admitting: Obstetrics and Gynecology

## 2016-02-13 ENCOUNTER — Other Ambulatory Visit: Payer: Self-pay

## 2016-02-13 DIAGNOSIS — O021 Missed abortion: Secondary | ICD-10-CM

## 2016-02-19 ENCOUNTER — Other Ambulatory Visit: Payer: Managed Care, Other (non HMO)

## 2016-02-19 DIAGNOSIS — O021 Missed abortion: Secondary | ICD-10-CM

## 2016-02-20 LAB — BETA HCG QUANT (REF LAB): HCG QUANT: 5 m[IU]/mL

## 2016-03-14 DIAGNOSIS — G43019 Migraine without aura, intractable, without status migrainosus: Secondary | ICD-10-CM | POA: Insufficient documentation

## 2016-03-15 ENCOUNTER — Encounter: Payer: Managed Care, Other (non HMO) | Admitting: Obstetrics and Gynecology

## 2016-04-11 ENCOUNTER — Encounter: Payer: Self-pay | Admitting: Obstetrics and Gynecology

## 2016-04-11 ENCOUNTER — Other Ambulatory Visit: Payer: Self-pay | Admitting: Obstetrics and Gynecology

## 2016-04-11 ENCOUNTER — Ambulatory Visit (INDEPENDENT_AMBULATORY_CARE_PROVIDER_SITE_OTHER): Payer: Managed Care, Other (non HMO) | Admitting: Obstetrics and Gynecology

## 2016-04-11 VITALS — BP 137/76 | HR 87 | Ht 64.0 in | Wt 228.8 lb

## 2016-04-11 DIAGNOSIS — E669 Obesity, unspecified: Secondary | ICD-10-CM | POA: Diagnosis not present

## 2016-04-11 DIAGNOSIS — Z01419 Encounter for gynecological examination (general) (routine) without abnormal findings: Secondary | ICD-10-CM | POA: Diagnosis not present

## 2016-04-11 NOTE — Progress Notes (Signed)
   Subjective:     Alicia Howard is a 26 y.o. female and is here for a comprehensive physical exam. 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 Not using contraception as OK if pregnancy occurs. Feels fine since miscarriage in December and reports normal return of monthly menses.  Social History   Social History  . Marital status: Married    Spouse name: N/A  . Number of children: N/A  . Years of education: N/A   Occupational History  . Not on file.   Social History Main Topics  . Smoking status: Never Smoker  . Smokeless tobacco: Never Used  . Alcohol use No  . Drug use: No  . Sexual activity: Yes    Birth control/ protection: Coitus interruptus   Other Topics Concern  . Not on file   Social History Narrative  . No narrative on file   Health Maintenance  Topic Date Due  . HIV Screening  07/03/2003  . TETANUS/TDAP  07/03/2007  . INFLUENZA VACCINE  09/12/2015  . PAP SMEAR  03/15/2018    The following portions of the patient's history were reviewed and updated as appropriate: allergies, current medications, past family history, past medical history, past social history, past surgical history and problem list.  Review of Systems Pertinent items noted in HPI and remainder of comprehensive ROS otherwise negative.   Objective:    General appearance: alert, cooperative and appears stated age Neck: no adenopathy, no carotid bruit, no JVD, supple, symmetrical, trachea midline and thyroid not enlarged, symmetric, no tenderness/mass/nodules Lungs: clear to auscultation bilaterally Breasts: normal appearance, no masses or tenderness Heart: regular rate and rhythm, S1, S2 normal, no murmur, click, rub or gallop Abdomen: soft, non-tender; bowel sounds normal; no masses,  no organomegaly Pelvic: cervix normal in appearance, external genitalia normal, no adnexal masses or tenderness, no cervical motion tenderness,  rectovaginal septum normal, uterus normal size, shape, and consistency and vagina normal without discharge    Assessment:    Healthy female exam. Obesity, headaches, h/o vit d deficiency     Plan:  Labs obtained, will follow up accordingly RTC 1 year or as needed.  Puja Caffey Aura CampsShambley, CNM   See After Visit Summary for Counseling Recommendations

## 2016-04-11 NOTE — Patient Instructions (Signed)
 Preventive Care 18-39 Years, Female Preventive care refers to lifestyle choices and visits with your health care provider that can promote health and wellness. What does preventive care include?  A yearly physical exam. This is also called an annual well check.  Dental exams once or twice a year.  Routine eye exams. Ask your health care provider how often you should have your eyes checked.  Personal lifestyle choices, including:  Daily care of your teeth and gums.  Regular physical activity.  Eating a healthy diet.  Avoiding tobacco and drug use.  Limiting alcohol use.  Practicing safe sex.  Taking vitamin and mineral supplements as recommended by your health care provider. What happens during an annual well check? The services and screenings done by your health care provider during your annual well check will depend on your age, overall health, lifestyle risk factors, and family history of disease. Counseling  Your health care provider may ask you questions about your:  Alcohol use.  Tobacco use.  Drug use.  Emotional well-being.  Home and relationship well-being.  Sexual activity.  Eating habits.  Work and work environment.  Method of birth control.  Menstrual cycle.  Pregnancy history. Screening  You may have the following tests or measurements:  Height, weight, and BMI.  Diabetes screening. This is done by checking your blood sugar (glucose) after you have not eaten for a while (fasting).  Blood pressure.  Lipid and cholesterol levels. These may be checked every 5 years starting at age 20.  Skin check.  Hepatitis C blood test.  Hepatitis B blood test.  Sexually transmitted disease (STD) testing.  BRCA-related cancer screening. This may be done if you have a family history of breast, ovarian, tubal, or peritoneal cancers.  Pelvic exam and Pap test. This may be done every 3 years starting at age 21. Starting at age 30, this may be done  every 5 years if you have a Pap test in combination with an HPV test. Discuss your test results, treatment options, and if necessary, the need for more tests with your health care provider. Vaccines  Your health care provider may recommend certain vaccines, such as:  Influenza vaccine. This is recommended every year.  Tetanus, diphtheria, and acellular pertussis (Tdap, Td) vaccine. You may need a Td booster every 10 years.  Varicella vaccine. You may need this if you have not been vaccinated.  HPV vaccine. If you are 26 or younger, you may need three doses over 6 months.  Measles, mumps, and rubella (MMR) vaccine. You may need at least one dose of MMR. You may also need a second dose.  Pneumococcal 13-valent conjugate (PCV13) vaccine. You may need this if you have certain conditions and were not previously vaccinated.  Pneumococcal polysaccharide (PPSV23) vaccine. You may need one or two doses if you smoke cigarettes or if you have certain conditions.  Meningococcal vaccine. One dose is recommended if you are age 19-21 years and a first-year college student living in a residence hall, or if you have one of several medical conditions. You may also need additional booster doses.  Hepatitis A vaccine. You may need this if you have certain conditions or if you travel or work in places where you may be exposed to hepatitis A.  Hepatitis B vaccine. You may need this if you have certain conditions or if you travel or work in places where you may be exposed to hepatitis B.  Haemophilus influenzae type b (Hib) vaccine. You may need   this if you have certain risk factors. Talk to your health care provider about which screenings and vaccines you need and how often you need them. This information is not intended to replace advice given to you by your health care provider. Make sure you discuss any questions you have with your health care provider. Document Released: 03/26/2001 Document Revised:  10/18/2015 Document Reviewed: 11/29/2014 Elsevier Interactive Patient Education  2017 Elsevier Inc.  

## 2016-04-12 ENCOUNTER — Other Ambulatory Visit: Payer: Self-pay | Admitting: Obstetrics and Gynecology

## 2016-04-12 LAB — VITAMIN D 25 HYDROXY (VIT D DEFICIENCY, FRACTURES): Vit D, 25-Hydroxy: 16.2 ng/mL — ABNORMAL LOW (ref 30.0–100.0)

## 2016-04-12 LAB — THYROID PANEL WITH TSH
FREE THYROXINE INDEX: 1.8 (ref 1.2–4.9)
T3 Uptake Ratio: 23 % — ABNORMAL LOW (ref 24–39)
T4, Total: 7.8 ug/dL (ref 4.5–12.0)
TSH: 4.28 u[IU]/mL (ref 0.450–4.500)

## 2016-04-12 LAB — LIPID PANEL
Chol/HDL Ratio: 2.1 ratio units (ref 0.0–4.4)
Cholesterol, Total: 152 mg/dL (ref 100–199)
HDL: 72 mg/dL (ref >39)
LDL Calculated: 72 mg/dL (ref 0–99)
Triglycerides: 40 mg/dL (ref 0–149)
VLDL CHOLESTEROL CAL: 8 mg/dL (ref 5–40)

## 2016-04-12 LAB — COMPREHENSIVE METABOLIC PANEL
ALBUMIN: 4.5 g/dL (ref 3.5–5.5)
ALK PHOS: 73 IU/L (ref 39–117)
ALT: 13 IU/L (ref 0–32)
AST: 15 IU/L (ref 0–40)
Albumin/Globulin Ratio: 1.7 (ref 1.2–2.2)
BUN/Creatinine Ratio: 17 (ref 9–23)
BUN: 13 mg/dL (ref 6–20)
Bilirubin Total: <0.2 mg/dL (ref 0.0–1.2)
CO2: 26 mmol/L (ref 18–29)
CREATININE: 0.75 mg/dL (ref 0.57–1.00)
Calcium: 9.3 mg/dL (ref 8.7–10.2)
Chloride: 97 mmol/L (ref 96–106)
GFR calc Af Amer: 126 mL/min/{1.73_m2} (ref >59)
GFR calc non Af Amer: 110 mL/min/{1.73_m2} (ref >59)
GLUCOSE: 93 mg/dL (ref 65–99)
Globulin, Total: 2.6 g/dL (ref 1.5–4.5)
Potassium: 4.2 mmol/L (ref 3.5–5.2)
Sodium: 141 mmol/L (ref 134–144)
Total Protein: 7.1 g/dL (ref 6.0–8.5)

## 2016-04-12 LAB — HEMOGLOBIN A1C
ESTIMATED AVERAGE GLUCOSE: 94 mg/dL
HEMOGLOBIN A1C: 4.9 % (ref 4.8–5.6)

## 2016-04-12 LAB — MAGNESIUM: Magnesium: 2.1 mg/dL (ref 1.6–2.3)

## 2016-04-12 MED ORDER — VITAMIN D (ERGOCALCIFEROL) 1.25 MG (50000 UNIT) PO CAPS: 50000.0000 [IU] | ORAL_CAPSULE | ORAL | 1 refills | Status: DC

## 2016-04-15 LAB — CYTOLOGY - PAP

## 2016-07-05 ENCOUNTER — Ambulatory Visit (INDEPENDENT_AMBULATORY_CARE_PROVIDER_SITE_OTHER): Payer: Managed Care, Other (non HMO) | Admitting: Obstetrics and Gynecology

## 2016-07-05 ENCOUNTER — Encounter: Payer: Self-pay | Admitting: Obstetrics and Gynecology

## 2016-07-05 VITALS — BP 131/71 | HR 99 | Wt 233.0 lb

## 2016-07-05 DIAGNOSIS — N926 Irregular menstruation, unspecified: Secondary | ICD-10-CM | POA: Diagnosis not present

## 2016-07-05 LAB — POCT URINE PREGNANCY: PREG TEST UR: POSITIVE — AB

## 2016-07-05 NOTE — Progress Notes (Signed)
Subjective:     Patient ID: Alicia Howard, female   DOB: [birth year], 26 y.o.   MRN: [id number]  HPI Reports missed menses with LMP 05/25/16, and home UPT+ last week. EDC 03/01/17 and EGA [id number]. Had missed AB last year following normal pregnancy 4 years ago. Is having headaches (not new for her)  Review of Systems Negative except stated above in HPI    Objective:   Physical Exam A&O x4 Well groomed female in no distress Blood pressure 131/71, pulse 99, weight 233 lb (105.7 kg), last menstrual period 05/25/2016, unknown if currently breastfeeding. UPT+ Pelvic exam deferred    Assessment:     Missed menses Previous SAB    Plan:     Bhcg obtained today- will follow up accordingly if they do not match up with LMP. RTC 1 week for viability scan/nurse intake and labs. OK to continue magnesium prn for migraines.   Alicia Howard, CNM

## 2016-07-05 NOTE — Patient Instructions (Signed)
First Trimester of Pregnancy The first trimester of pregnancy is from week 1 until the end of week 13 (months 1 through 3). A week after a sperm fertilizes an egg, the egg will implant on the wall of the uterus. This embryo will begin to develop into a baby. Genes from you and your partner will form the baby. The female genes will determine whether the baby will be a boy or a girl. At 6-8 weeks, the eyes and face will be formed, and the heartbeat can be seen on ultrasound. At the end of 12 weeks, all the baby's organs will be formed. Now that you are pregnant, you will want to do everything you can to have a healthy baby. Two of the most important things are to get good prenatal care and to follow your health care provider's instructions. Prenatal care is all the medical care you receive before the baby's birth. This care will help prevent, find, and treat any problems during the pregnancy and childbirth. Body changes during your first trimester Your body goes through many changes during pregnancy. The changes vary from woman to woman.  You may gain or lose a couple of pounds at first.  You may feel sick to your stomach (nauseous) and you may throw up (vomit). If the vomiting is uncontrollable, call your health care provider.  You may tire easily.  You may develop headaches that can be relieved by medicines. All medicines should be approved by your health care provider.  You may urinate more often. Painful urination may mean you have a bladder infection.  You may develop heartburn as a result of your pregnancy.  You may develop constipation because certain hormones are causing the muscles that push stool through your intestines to slow down.  You may develop hemorrhoids or swollen veins (varicose veins).  Your breasts may begin to grow larger and become tender. Your nipples may stick out more, and the tissue that surrounds them (areola) may become darker.  Your gums may bleed and may be  sensitive to brushing and flossing.  Dark spots or blotches (chloasma, mask of pregnancy) may develop on your face. This will likely fade after the baby is born.  Your menstrual periods will stop.  You may have a loss of appetite.  You may develop cravings for certain kinds of food.  You may have changes in your emotions from day to day, such as being excited to be pregnant or being concerned that something may go wrong with the pregnancy and baby.  You may have more vivid and strange dreams.  You may have changes in your hair. These can include thickening of your hair, rapid growth, and changes in texture. Some women also have hair loss during or after pregnancy, or hair that feels dry or thin. Your hair will most likely return to normal after your baby is born.  What to expect at prenatal visits During a routine prenatal visit:  You will be weighed to make sure you and the baby are growing normally.  Your blood pressure will be taken.  Your abdomen will be measured to track your baby's growth.  The fetal heartbeat will be listened to between weeks 10 and 14 of your pregnancy.  Test results from any previous visits will be discussed.  Your health care provider may ask you:  How you are feeling.  If you are feeling the baby move.  If you have had any abnormal symptoms, such as leaking fluid, bleeding, severe headaches,   or abdominal cramping.  If you are using any tobacco products, including cigarettes, chewing tobacco, and electronic cigarettes.  If you have any questions.  Other tests that may be performed during your first trimester include:  Blood tests to find your blood type and to check for the presence of any previous infections. The tests will also be used to check for low iron levels (anemia) and protein on red blood cells (Rh antibodies). Depending on your risk factors, or if you previously had diabetes during pregnancy, you may have tests to check for high blood  sugar that affects pregnant women (gestational diabetes).  Urine tests to check for infections, diabetes, or protein in the urine.  An ultrasound to confirm the proper growth and development of the baby.  Fetal screens for spinal cord problems (spina bifida) and Down syndrome.  HIV (human immunodeficiency virus) testing. Routine prenatal testing includes screening for HIV, unless you choose not to have this test.  You may need other tests to make sure you and the baby are doing well.  Follow these instructions at home: Medicines  Follow your health care provider's instructions regarding medicine use. Specific medicines may be either safe or unsafe to take during pregnancy.  Take a prenatal vitamin that contains at least 600 micrograms (mcg) of folic acid.  If you develop constipation, try taking a stool softener if your health care provider approves. Eating and drinking  Eat a balanced diet that includes fresh fruits and vegetables, whole grains, good sources of protein such as meat, eggs, or tofu, and low-fat dairy. Your health care provider will help you determine the amount of weight gain that is right for you.  Avoid raw meat and uncooked cheese. These carry germs that can cause birth defects in the baby.  Eating four or five small meals rather than three large meals a day may help relieve nausea and vomiting. If you start to feel nauseous, eating a few soda crackers can be helpful. Drinking liquids between meals, instead of during meals, also seems to help ease nausea and vomiting.  Limit foods that are high in fat and processed sugars, such as fried and sweet foods.  To prevent constipation: ? Eat foods that are high in fiber, such as fresh fruits and vegetables, whole grains, and beans. ? Drink enough fluid to keep your urine clear or pale yellow. Activity  Exercise only as directed by your health care provider. Most women can continue their usual exercise routine during  pregnancy. Try to exercise for 30 minutes at least 5 days a week. Exercising will help you: ? Control your weight. ? Stay in shape. ? Be prepared for labor and delivery.  Experiencing pain or cramping in the lower abdomen or lower back is a good sign that you should stop exercising. Check with your health care provider before continuing with normal exercises.  Try to avoid standing for long periods of time. Move your legs often if you must stand in one place for a long time.  Avoid heavy lifting.  Wear low-heeled shoes and practice good posture.  You may continue to have sex unless your health care provider tells you not to. Relieving pain and discomfort  Wear a good support bra to relieve breast tenderness.  Take warm sitz baths to soothe any pain or discomfort caused by hemorrhoids. Use hemorrhoid cream if your health care provider approves.  Rest with your legs elevated if you have leg cramps or low back pain.  If you develop   varicose veins in your legs, wear support hose. Elevate your feet for 15 minutes, 3-4 times a day. Limit salt in your diet. Prenatal care  Schedule your prenatal visits by the twelfth week of pregnancy. They are usually scheduled monthly at first, then more often in the last 2 months before delivery.  Write down your questions. Take them to your prenatal visits.  Keep all your prenatal visits as told by your health care provider. This is important. Safety  Wear your seat belt at all times when driving.  Make a list of emergency phone numbers, including numbers for family, friends, the hospital, and police and fire departments. General instructions  Ask your health care provider for a referral to a local prenatal education class. Begin classes no later than the beginning of month 6 of your pregnancy.  Ask for help if you have counseling or nutritional needs during pregnancy. Your health care provider can offer advice or refer you to specialists for help  with various needs.  Do not use hot tubs, steam rooms, or saunas.  Do not douche or use tampons or scented sanitary pads.  Do not cross your legs for long periods of time.  Avoid cat litter boxes and soil used by cats. These carry germs that can cause birth defects in the baby and possibly loss of the fetus by miscarriage or stillbirth.  Avoid all smoking, herbs, alcohol, and medicines not prescribed by your health care provider. Chemicals in these products affect the formation and growth of the baby.  Do not use any products that contain nicotine or tobacco, such as cigarettes and e-cigarettes. If you need help quitting, ask your health care provider. You may receive counseling support and other resources to help you quit.  Schedule a dentist appointment. At home, brush your teeth with a soft toothbrush and be gentle when you floss. Contact a health care provider if:  You have dizziness.  You have mild pelvic cramps, pelvic pressure, or nagging pain in the abdominal area.  You have persistent nausea, vomiting, or diarrhea.  You have a bad smelling vaginal discharge.  You have pain when you urinate.  You notice increased swelling in your face, hands, legs, or ankles.  You are exposed to fifth disease or chickenpox.  You are exposed to German measles (rubella) and have never had it. Get help right away if:  You have a fever.  You are leaking fluid from your vagina.  You have spotting or bleeding from your vagina.  You have severe abdominal cramping or pain.  You have rapid weight gain or loss.  You vomit blood or material that looks like coffee grounds.  You develop a severe headache.  You have shortness of breath.  You have any kind of trauma, such as from a fall or a car accident. Summary  The first trimester of pregnancy is from week 1 until the end of week 13 (months 1 through 3).  Your body goes through many changes during pregnancy. The changes vary from  woman to woman.  You will have routine prenatal visits. During those visits, your health care provider will examine you, discuss any test results you may have, and talk with you about how you are feeling. This information is not intended to replace advice given to you by your health care provider. Make sure you discuss any questions you have with your health care provider. Document Released: 01/22/2001 Document Revised: 01/10/2016 Document Reviewed: 01/10/2016 Elsevier Interactive Patient Education  2017 [employer]  

## 2016-07-06 LAB — BETA HCG QUANT (REF LAB): HCG QUANT: 18941 m[IU]/mL

## 2016-07-16 ENCOUNTER — Other Ambulatory Visit: Payer: Self-pay | Admitting: *Deleted

## 2016-07-16 ENCOUNTER — Telehealth: Payer: Self-pay | Admitting: Certified Nurse Midwife

## 2016-07-16 MED ORDER — PROMETHAZINE HCL 12.5 MG RE SUPP: 12.5000 mg | Freq: Four times a day (QID) | RECTAL | 0 refills | Status: DC | PRN

## 2016-07-16 NOTE — Telephone Encounter (Signed)
Sent pt in some phenegran supp, husband notified

## 2016-07-16 NOTE — Telephone Encounter (Signed)
Patient has nausea and vomiting so bad she can't keep anything down. Please call asap

## 2016-07-18 ENCOUNTER — Ambulatory Visit (INDEPENDENT_AMBULATORY_CARE_PROVIDER_SITE_OTHER): Payer: Managed Care, Other (non HMO)

## 2016-07-18 ENCOUNTER — Ambulatory Visit: Payer: Managed Care, Other (non HMO) | Admitting: Certified Nurse Midwife

## 2016-07-18 VITALS — BP 124/79 | HR 85 | Ht 64.0 in | Wt 229.1 lb

## 2016-07-18 DIAGNOSIS — N926 Irregular menstruation, unspecified: Secondary | ICD-10-CM

## 2016-07-18 DIAGNOSIS — Z3491 Encounter for supervision of normal pregnancy, unspecified, first trimester: Secondary | ICD-10-CM

## 2016-07-18 DIAGNOSIS — E669 Obesity, unspecified: Secondary | ICD-10-CM

## 2016-07-18 NOTE — Progress Notes (Signed)
Alicia CreaseSarah D Howard presents for NOB nurse interview visit. Pregnancy confirmation done _5/25/18 here_____.  G-3 .  [id number] . Pregnancy education material explained and given. __0_ cats in the home. NOB labs ordered. (TSH/HbgA1c due to Increased BMI), . HIV labs and Drug screen were explained optional and she did not decline. Drug screen ordered. PNV encouraged. Genetic screening options discussed. Genetic testing: Ordered/Declined/Unsure.  Pt may discuss with provider. Pt. To follow up with provider in _4_ weeks for NOB physical.  All questions answered.

## 2016-08-01 ENCOUNTER — Encounter: Payer: Self-pay | Admitting: Obstetrics and Gynecology

## 2016-08-01 ENCOUNTER — Other Ambulatory Visit: Payer: Self-pay | Admitting: Obstetrics and Gynecology

## 2016-08-01 MED ORDER — ONDANSETRON 4 MG PO TBDP: 4.0000 mg | ORAL_TABLET | Freq: Four times a day (QID) | ORAL | 0 refills | Status: DC | PRN

## 2016-08-10 ENCOUNTER — Encounter: Payer: Self-pay | Admitting: Obstetrics and Gynecology

## 2016-08-12 ENCOUNTER — Other Ambulatory Visit: Payer: Managed Care, Other (non HMO)

## 2016-08-13 LAB — CBC WITH DIFFERENTIAL
Basophils Absolute: 0 10*3/uL (ref 0.0–0.2)
Basos: 0 %
EOS (ABSOLUTE): 0.1 10*3/uL (ref 0.0–0.4)
EOS: 2 %
HEMATOCRIT: 39.9 % (ref 34.0–46.6)
HEMOGLOBIN: 13.3 g/dL (ref 11.1–15.9)
IMMATURE GRANS (ABS): 0 10*3/uL (ref 0.0–0.1)
IMMATURE GRANULOCYTES: 0 %
Lymphocytes Absolute: 1.6 10*3/uL (ref 0.7–3.1)
Lymphs: 21 %
MCH: 30 pg (ref 26.6–33.0)
MCHC: 33.3 g/dL (ref 31.5–35.7)
MCV: 90 fL (ref 79–97)
MONOCYTES: 8 %
Monocytes Absolute: 0.6 10*3/uL (ref 0.1–0.9)
Neutrophils Absolute: 5.3 10*3/uL (ref 1.4–7.0)
Neutrophils: 69 %
RBC: 4.44 x10E6/uL (ref 3.77–5.28)
RDW: 13.5 % (ref 12.3–15.4)
WBC: 7.6 10*3/uL (ref 3.4–10.8)

## 2016-08-13 LAB — RUBELLA SCREEN: Rubella Antibodies, IGG: 1.02 index (ref >0.99)

## 2016-08-13 LAB — ANTIBODY SCREEN: ANTIBODY SCREEN: NEGATIVE

## 2016-08-13 LAB — ABO AND RH: Rh Factor: POSITIVE

## 2016-08-13 LAB — HEPATITIS B SURFACE ANTIGEN: Hepatitis B Surface Ag: NEGATIVE

## 2016-08-13 LAB — HIV ANTIBODY (ROUTINE TESTING W REFLEX): HIV SCREEN 4TH GENERATION: NONREACTIVE

## 2016-08-13 LAB — HEMOGLOBIN A1C
Est. average glucose Bld gHb Est-mCnc: 94 mg/dL
Hgb A1c MFr Bld: 4.9 % (ref 4.8–5.6)

## 2016-08-13 LAB — TSH: TSH: 1.64 u[IU]/mL (ref 0.450–4.500)

## 2016-08-13 LAB — RPR: RPR: NONREACTIVE

## 2016-08-13 LAB — VARICELLA ZOSTER ANTIBODY, IGG: Varicella zoster IgG: 1344 index (ref >165)

## 2016-08-19 ENCOUNTER — Other Ambulatory Visit: Payer: Self-pay

## 2016-08-19 ENCOUNTER — Encounter: Payer: Self-pay | Admitting: Certified Nurse Midwife

## 2016-08-19 ENCOUNTER — Other Ambulatory Visit: Payer: Self-pay | Admitting: Certified Nurse Midwife

## 2016-08-19 ENCOUNTER — Ambulatory Visit (INDEPENDENT_AMBULATORY_CARE_PROVIDER_SITE_OTHER): Payer: Managed Care, Other (non HMO) | Admitting: Certified Nurse Midwife

## 2016-08-19 VITALS — BP 106/78 | HR 84 | Wt 219.5 lb

## 2016-08-19 DIAGNOSIS — Z3491 Encounter for supervision of normal pregnancy, unspecified, first trimester: Secondary | ICD-10-CM

## 2016-08-19 DIAGNOSIS — O219 Vomiting of pregnancy, unspecified: Secondary | ICD-10-CM

## 2016-08-19 LAB — POCT URINALYSIS DIPSTICK
BILIRUBIN UA: NEGATIVE
Glucose, UA: NEGATIVE
KETONES UA: NEGATIVE
Leukocytes, UA: NEGATIVE
Nitrite, UA: NEGATIVE
PH UA: 8 (ref 5.0–8.0)
RBC UA: NEGATIVE
SPEC GRAV UA: 1.01 (ref 1.010–1.025)
Urobilinogen, UA: 0.2 E.U./dL

## 2016-08-19 MED ORDER — ONDANSETRON 4 MG PO TBDP: 4.0000 mg | ORAL_TABLET | Freq: Four times a day (QID) | ORAL | 0 refills | Status: DC | PRN

## 2016-08-19 NOTE — Patient Instructions (Signed)
Morning Sickness Morning sickness is when you feel sick to your stomach (nauseous) during pregnancy. You may feel sick to your stomach and throw up (vomit). You may feel sick in the morning, but you can feel this way any time of day. Some women feel very sick to their stomach and cannot stop throwing up (hyperemesis gravidarum). Follow these instructions at home:  Only take medicines as told by your doctor.  Take multivitamins as told by your doctor. Taking multivitamins before getting pregnant can stop or lessen the harshness of morning sickness.  Eat dry toast or unsalted crackers before getting out of bed.  Eat 5 to 6 small meals a day.  Eat dry and bland foods like rice and baked potatoes.  Do not drink liquids with meals. Drink between meals.  Do not eat greasy, fatty, or spicy foods.  Have someone cook for you if the smell of food causes you to feel sick or throw up.  If you feel sick to your stomach after taking prenatal vitamins, take them at night or with a snack.  Eat protein when you need a snack (nuts, yogurt, cheese).  Eat unsweetened gelatins for dessert.  Wear a bracelet used for sea sickness (acupressure wristband).  Go to a doctor that puts thin needles into certain body points (acupuncture) to improve how you feel.  Do not smoke.  Use a humidifier to keep the air in your house free of odors.  Get lots of fresh air. Contact a doctor if:  You need medicine to feel better.  You feel dizzy or lightheaded.  You are losing weight. Get help right away if:  You feel very sick to your stomach and cannot stop throwing up.  You pass out (faint). This information is not intended to replace advice given to you by your health care provider. Make sure you discuss any questions you have with your health care provider. Document Released: 03/07/2004 Document Revised: 07/06/2015 Document Reviewed: 07/15/2012 Elsevier Interactive Patient Education  2017 [employer] Second Trimester of Pregnancy The second trimester is from week 14 through week 27 (months 4 through 6). The second trimester is often a time when you feel your best. Your body has adjusted to being pregnant, and you begin to feel better physically. Usually, morning sickness has lessened or quit completely, you may have more energy, and you may have an increase in appetite. The second trimester is also a time when the fetus is growing rapidly. At the end of the sixth month, the fetus is about 9 inches long and weighs about 1 pounds. You will likely begin to feel the baby move (quickening) between 16 and 20 weeks of pregnancy. Body changes during your second trimester Your body continues to go through many changes during your second trimester. The changes vary from woman to woman.  Your weight will continue to increase. You will notice your lower abdomen bulging out.  You may begin to get stretch marks on your hips, abdomen, and breasts.  You may develop headaches that can be relieved by medicines. The medicines should be approved by your health care provider.  You may urinate more often because the fetus is pressing on your bladder.  You may develop or continue to have heartburn as a result of your pregnancy.  You may develop constipation because certain hormones are causing the muscles that push waste through your intestines to slow down.  You may develop hemorrhoids or swollen, bulging veins (varicose veins).  You may have   back pain. This is caused by: ? Weight gain. ? Pregnancy hormones that are relaxing the joints in your pelvis. ? A shift in weight and the muscles that support your balance.  Your breasts will continue to grow and they will continue to become tender.  Your gums may bleed and may be sensitive to brushing and flossing.  Dark spots or blotches (chloasma, mask of pregnancy) may develop on your face. This will likely fade after the baby is born.  A dark line from  your belly button to the pubic area (linea nigra) may appear. This will likely fade after the baby is born.  You may have changes in your hair. These can include thickening of your hair, rapid growth, and changes in texture. Some women also have hair loss during or after pregnancy, or hair that feels dry or thin. Your hair will most likely return to normal after your baby is born.  What to expect at prenatal visits During a routine prenatal visit:  You will be weighed to make sure you and the fetus are growing normally.  Your blood pressure will be taken.  Your abdomen will be measured to track your baby's growth.  The fetal heartbeat will be listened to.  Any test results from the previous visit will be discussed.  Your health care provider may ask you:  How you are feeling.  If you are feeling the baby move.  If you have had any abnormal symptoms, such as leaking fluid, bleeding, severe headaches, or abdominal cramping.  If you are using any tobacco products, including cigarettes, chewing tobacco, and electronic cigarettes.  If you have any questions.  Other tests that may be performed during your second trimester include:  Blood tests that check for: ? Low iron levels (anemia). ? High blood sugar that affects pregnant women (gestational diabetes) between 24 and 28 weeks. ? Rh antibodies. This is to check for a protein on red blood cells (Rh factor).  Urine tests to check for infections, diabetes, or protein in the urine.  An ultrasound to confirm the proper growth and development of the baby.  An amniocentesis to check for possible genetic problems.  Fetal screens for spina bifida and Down syndrome.  HIV (human immunodeficiency virus) testing. Routine prenatal testing includes screening for HIV, unless you choose not to have this test.  Follow these instructions at home: Medicines  Follow your health care provider's instructions regarding medicine use. Specific  medicines may be either safe or unsafe to take during pregnancy.  Take a prenatal vitamin that contains at least 600 micrograms (mcg) of folic acid.  If you develop constipation, try taking a stool softener if your health care provider approves. Eating and drinking  Eat a balanced diet that includes fresh fruits and vegetables, whole grains, good sources of protein such as meat, eggs, or tofu, and low-fat dairy. Your health care provider will help you determine the amount of weight gain that is right for you.  Avoid raw meat and uncooked cheese. These carry germs that can cause birth defects in the baby.  If you have low calcium intake from food, talk to your health care provider about whether you should take a daily calcium supplement.  Limit foods that are high in fat and processed sugars, such as fried and sweet foods.  To prevent constipation: ? Drink enough fluid to keep your urine clear or pale yellow. ? Eat foods that are high in fiber, such as fresh fruits and vegetables, whole   grains, and beans. Activity  Exercise only as directed by your health care provider. Most women can continue their usual exercise routine during pregnancy. Try to exercise for 30 minutes at least 5 days a week. Stop exercising if you experience uterine contractions.  Avoid heavy lifting, wear low heel shoes, and practice good posture.  A sexual relationship may be continued unless your health care provider directs you otherwise. Relieving pain and discomfort  Wear a good support bra to prevent discomfort from breast tenderness.  Take warm sitz baths to soothe any pain or discomfort caused by hemorrhoids. Use hemorrhoid cream if your health care provider approves.  Rest with your legs elevated if you have leg cramps or low back pain.  If you develop varicose veins, wear support hose. Elevate your feet for 15 minutes, 3-4 times a day. Limit salt in your diet. Prenatal Care  Write down your questions.  Take them to your prenatal visits.  Keep all your prenatal visits as told by your health care provider. This is important. Safety  Wear your seat belt at all times when driving.  Make a list of emergency phone numbers, including numbers for family, friends, the hospital, and police and fire departments. General instructions  Ask your health care provider for a referral to a local prenatal education class. Begin classes no later than the beginning of month 6 of your pregnancy.  Ask for help if you have counseling or nutritional needs during pregnancy. Your health care provider can offer advice or refer you to specialists for help with various needs.  Do not use hot tubs, steam rooms, or saunas.  Do not douche or use tampons or scented sanitary pads.  Do not cross your legs for long periods of time.  Avoid cat litter boxes and soil used by cats. These carry germs that can cause birth defects in the baby and possibly loss of the fetus by miscarriage or stillbirth.  Avoid all smoking, herbs, alcohol, and unprescribed drugs. Chemicals in these products can affect the formation and growth of the baby.  Do not use any products that contain nicotine or tobacco, such as cigarettes and e-cigarettes. If you need help quitting, ask your health care provider.  Visit your dentist if you have not gone yet during your pregnancy. Use a soft toothbrush to brush your teeth and be gentle when you floss. Contact a health care provider if:  You have dizziness.  You have mild pelvic cramps, pelvic pressure, or nagging pain in the abdominal area.  You have persistent nausea, vomiting, or diarrhea.  You have a bad smelling vaginal discharge.  You have pain when you urinate. Get help right away if:  You have a fever.  You are leaking fluid from your vagina.  You have spotting or bleeding from your vagina.  You have severe abdominal cramping or pain.  You have rapid weight gain or weight  loss.  You have shortness of breath with chest pain.  You notice sudden or extreme swelling of your face, hands, ankles, feet, or legs.  You have not felt your baby move in over an hour.  You have severe headaches that do not go away when you take medicine.  You have vision changes. Summary  The second trimester is from week 14 through week 27 (months 4 through 6). It is also a time when the fetus is growing rapidly.  Your body goes through many changes during pregnancy. The changes vary from woman to woman.  Avoid   all smoking, herbs, alcohol, and unprescribed drugs. These chemicals affect the formation and growth your baby.  Do not use any tobacco products, such as cigarettes, chewing tobacco, and e-cigarettes. If you need help quitting, ask your health care provider.  Contact your health care provider if you have any questions. Keep all prenatal visits as told by your health care provider. This is important. This information is not intended to replace advice given to you by your health care provider. Make sure you discuss any questions you have with your health care provider. Document Released: 01/22/2001 Document Revised: 07/06/2015 Document Reviewed: 03/31/2012 Elsevier Interactive Patient Education  2017 [employer]  

## 2016-08-19 NOTE — Progress Notes (Signed)
NOB physical-pt is having to take zofran qd, states she needs refill on zofran, otherwise she is doing well

## 2016-08-19 NOTE — Progress Notes (Signed)
NEW OB HISTORY AND PHYSICAL  SUBJECTIVE:       Alicia Howard is a 26 y.o. [id number] female, Patient's last menstrual period was 05/25/2016 (exact date)., Estimated Date of Delivery: 03/01/17, [id number], presents today for establishment of Prenatal Care.  She has no unusual complaints and complains of breast tenderness and nausea with vomiting requiring Zofran.   Denies difficulty breathing or respiratory distress, chest pain, abdominal pain, vaginal bleeding, dysuria, and leg pain or swelling.   Declines genetic screening.  Gynecologic History  Patient's last menstrual period was 05/25/2016 (exact date).  Contraception: none   Last Pap: 04/11/2016. Results were: normal  Obstetric History  OB History  Gravida Para Term Preterm AB Living  3 1 1   1 1   SAB TAB Ectopic Multiple Live Births  1       1    # Outcome Date GA Lbr Len/2nd Weight Sex Delivery Anes PTL Lv  3 Current           2 SAB 2017 [id number]   U SAB  N LIV  1 Term 2014    M Vag-Spont   LIV      Past Medical History:  Diagnosis Date  . Dysmenorrhea   . Migraines   . Obesity     Past Surgical History:  Procedure Laterality Date  . none      Current Outpatient Prescriptions on File Prior to Visit  Medication Sig Dispense Refill  . magnesium 30 MG tablet Take 30 mg by mouth 2 (two) times daily.    . Prenatal Vit-Fe Fumarate-FA (PRENATAL MULTIVITAMIN) TABS tablet Take 1 tablet by mouth daily at 12 noon.    . promethazine (PHENERGAN) 12.5 MG suppository Place 1 suppository (12.5 mg total) rectally every 6 (six) hours as needed for nausea or vomiting. (Patient not taking: Reported on 08/19/2016) 12 each 0  . promethazine (PHENERGAN) 25 MG tablet Take by mouth.    . SUMAtriptan (IMITREX) 100 MG tablet Take by mouth.    . Vitamin D, Ergocalciferol, (DRISDOL) 50000 units CAPS capsule Take 1 capsule (50,000 Units total) by mouth 2 (two) times a week. (Patient not taking: Reported on 08/19/2016) 30 capsule 1   No current  facility-administered medications on file prior to visit.     Allergies  Allergen Reactions  . Latex Dermatitis    Social History   Social History  . Marital status: Married    Spouse name: N/A  . Number of children: N/A  . Years of education: N/A   Occupational History  . Not on file.   Social History Main Topics  . Smoking status: Never Smoker  . Smokeless tobacco: Never Used  . Alcohol use No  . Drug use: No  . Sexual activity: Yes    Birth control/ protection: None   Other Topics Concern  . Not on file   Social History Narrative  . No narrative on file    Family History  Problem Relation Age of Onset  . Hypertension Father   . Diabetes Maternal Grandmother   . Stroke Maternal Grandfather   . Cancer Paternal Grandfather        prostate    The following portions of the patient's history were reviewed and updated as appropriate: allergies, current medications, past OB history, past medical history, past surgical history, past family history, past social history, and problem list.  Review of systems:  ROS negative except as noted above. Information obtained from patient.   OBJECTIVE:  Initial  Physical Exam (New OB)  GENERAL APPEARANCE: alert, well appearing, in no apparent distress  HEAD: normocephalic, atraumatic  MOUTH: mucous membranes moist, pharynx normal without lesions  THYROID: no thyromegaly or masses present  BREASTS: not examined  LUNGS: clear to auscultation, no wheezes, rales or rhonchi, symmetric air entry  HEART: regular rate and rhythm, no murmurs  ABDOMEN: soft, nontender, nondistended, no abnormal masses, no epigastric pain, fundus not palpable and FHT present  EXTREMITIES: no redness or tenderness in the calves or thighs, no edema  SKIN: normal coloration and turgor, no rashes, professional tattoos present  LYMPH NODES: no adenopathy palpable  NEUROLOGIC: alert, oriented, normal speech, no focal findings or movement  disorder noted  PELVIC EXAM not examined  ASSESSMENT: Normal pregnancy Nausea and vomiting in pregnancy before 22 weeks BMI 40 Declines genetic screening  PLAN: Prenatal care Rx Zofran, see orders New OB counseling: The patient has been given an overview regarding routine prenatal care. Recommendations regarding diet, weight gain, and exercise in pregnancy were given. Prenatal testing, optional genetic testing, and ultrasound use in pregnancy were reviewed.  Benefits of Breast Feeding were discussed. The patient is encouraged to consider nursing her baby post partum. Reviewed red flag symptoms and when to call.  RTC x 4 weeks for ROB or sooner if needed.  See orders   Gunnar Bulla, CNM

## 2016-08-20 LAB — MONITOR DRUG PROFILE 14(MW)
AMPHETAMINE SCREEN URINE: NEGATIVE ng/mL
BARBITURATE SCREEN URINE: NEGATIVE ng/mL
BENZODIAZEPINE SCREEN, URINE: NEGATIVE ng/mL
BUPRENORPHINE, URINE: NEGATIVE ng/mL
CANNABINOIDS UR QL SCN: NEGATIVE ng/mL
COCAINE(METAB.)SCREEN, URINE: NEGATIVE ng/mL
Creatinine(Crt), U: 270.4 mg/dL (ref 20.0–300.0)
Fentanyl, Urine: NEGATIVE pg/mL
MEPERIDINE SCREEN, URINE: NEGATIVE ng/mL
Methadone Screen, Urine: NEGATIVE ng/mL
OXYCODONE+OXYMORPHONE UR QL SCN: NEGATIVE ng/mL
Opiate Scrn, Ur: NEGATIVE ng/mL
PH UR, DRUG SCRN: 7.7 (ref 4.5–8.9)
PHENCYCLIDINE QUANTITATIVE URINE: NEGATIVE ng/mL
PROPOXYPHENE SCREEN URINE: NEGATIVE ng/mL
SPECIFIC GRAVITY: 1.021
Tramadol Screen, Urine: NEGATIVE ng/mL

## 2016-08-20 LAB — NICOTINE SCREEN, URINE: Cotinine Ql Scrn, Ur: NEGATIVE ng/mL

## 2016-08-20 LAB — URINALYSIS, ROUTINE W REFLEX MICROSCOPIC
BILIRUBIN UA: NEGATIVE
GLUCOSE, UA: NEGATIVE
Leukocytes, UA: NEGATIVE
NITRITE UA: NEGATIVE
RBC UA: NEGATIVE
UUROB: 0.2 mg/dL (ref 0.2–1.0)
pH, UA: 8 — ABNORMAL HIGH (ref 5.0–7.5)

## 2016-08-20 LAB — MICROSCOPIC EXAMINATION: Casts: NONE SEEN /lpf

## 2016-08-21 LAB — URINE CULTURE

## 2016-09-05 ENCOUNTER — Other Ambulatory Visit: Payer: Self-pay | Admitting: Obstetrics and Gynecology

## 2016-09-16 ENCOUNTER — Ambulatory Visit (INDEPENDENT_AMBULATORY_CARE_PROVIDER_SITE_OTHER): Payer: Managed Care, Other (non HMO) | Admitting: Certified Nurse Midwife

## 2016-09-16 ENCOUNTER — Encounter: Payer: Self-pay | Admitting: Certified Nurse Midwife

## 2016-09-16 VITALS — BP 110/84 | HR 98 | Wt 226.7 lb

## 2016-09-16 DIAGNOSIS — Z3482 Encounter for supervision of other normal pregnancy, second trimester: Secondary | ICD-10-CM

## 2016-09-16 LAB — POCT URINALYSIS DIPSTICK
Blood, UA: NEGATIVE
GLUCOSE UA: NEGATIVE
Ketones, UA: NEGATIVE
LEUKOCYTES UA: NEGATIVE
NITRITE UA: NEGATIVE
PROTEIN UA: NEGATIVE
Spec Grav, UA: 1.015 (ref 1.010–1.025)
UROBILINOGEN UA: 0.2 U/dL
pH, UA: 6.5 (ref 5.0–8.0)

## 2016-09-16 MED ORDER — DOXYLAMINE-PYRIDOXINE ER 20-20 MG PO TBCR: 20.0000 mg | EXTENDED_RELEASE_TABLET | ORAL | 1 refills | Status: DC

## 2016-09-16 NOTE — Patient Instructions (Addendum)
How a Baby Grows During Pregnancy Pregnancy begins when a female's sperm enters a female's egg (fertilization). This happens in one of the tubes (fallopian tubes) that connect the ovaries to the womb (uterus). The fertilized egg is called an embryo until it reaches 10 weeks. From 10 weeks until birth, it is called a fetus. The fertilized egg moves down the fallopian tube to the uterus. Then it implants into the lining of the uterus and begins to grow. The developing fetus receives oxygen and nutrients through the pregnant woman's bloodstream and the tissues that grow (placenta) to support the fetus. The placenta is the life support system for the fetus. It provides nutrition and removes waste. Learning as much as you can about your pregnancy and how your baby is developing can help you enjoy the experience. It can also make you aware of when there might be a problem and when to ask questions. How long does a typical pregnancy last? A pregnancy usually lasts 280 days, or about 40 weeks. Pregnancy is divided into three trimesters:  First trimester: 0-13 weeks.  Second trimester: 14-27 weeks.  Third trimester: 28-40 weeks.  The day when your baby is considered ready to be born (full term) is your estimated date of delivery. How does my baby develop month by month? First month  The fertilized egg attaches to the inside of the uterus.  Some cells will form the placenta. Others will form the fetus.  The arms, legs, brain, spinal cord, lungs, and heart begin to develop.  At the end of the first month, the heart begins to beat.  Second month  The bones, inner ear, eyelids, hands, and feet form.  The genitals develop.  By the end of 8 weeks, all major organs are developing.  Third month  All of the internal organs are forming.  Teeth develop below the gums.  Bones and muscles begin to grow. The spine can flex.  The skin is transparent.  Fingernails and toenails begin to form.  Arms  and legs continue to grow longer, and hands and feet develop.  The fetus is about 3 in (7.6 cm) long.  Fourth month  The placenta is completely formed.  The external sex organs, neck, outer ear, eyebrows, eyelids, and fingernails are formed.  The fetus can hear, swallow, and move its arms and legs.  The kidneys begin to produce urine.  The skin is covered with a white waxy coating (vernix) and very fine hair (lanugo).  Fifth month  The fetus moves around more and can be felt for the first time (quickening).  The fetus starts to sleep and wake up and may begin to suck its finger.  The nails grow to the end of the fingers.  The organ in the digestive system that makes bile (gallbladder) functions and helps to digest the nutrients.  If your baby is a girl, eggs are present in her ovaries. If your baby is a boy, testicles start to move down into his scrotum.  Sixth month  The lungs are formed, but the fetus is not yet able to breathe.  The eyes open. The brain continues to develop.  Your baby has fingerprints and toe prints. Your baby's hair grows thicker.  At the end of the second trimester, the fetus is about 9 in (22.9 cm) long.  Seventh month  The fetus kicks and stretches.  The eyes are developed enough to sense changes in light.  The hands can make a grasping motion.  The  fetus responds to sound.  Eighth month  All organs and body systems are fully developed and functioning.  Bones harden and taste buds develop. The fetus may hiccup.  Certain areas of the brain are still developing. The skull remains soft.  Ninth month  The fetus gains about  lb (0.23 kg) each week.  The lungs are fully developed.  Patterns of sleep develop.  The fetus's head typically moves into a head-down position (vertex) in the uterus to prepare for birth. If the buttocks move into a vertex position instead, the baby is breech.  The fetus weighs 6-9 lbs (2.72-4.08 kg) and is  19-20 in (48.26-50.8 cm) long.  What can I do to have a healthy pregnancy and help my baby develop? Eating and Drinking  Eat a healthy diet. ? Talk with your health care provider to make sure that you are getting the nutrients that you and your baby need. ? Visit www.BuildDNA.es to learn about creating a healthy diet.  Gain a healthy amount of weight during pregnancy as advised by your health care provider. This is usually 25-35 pounds. You may need to: ? Gain more if you were underweight before getting pregnant or if you are pregnant with more than one baby. ? Gain less if you were overweight or obese when you got pregnant.  Medicines and Vitamins  Take prenatal vitamins as directed by your health care provider. These include vitamins such as folic acid, iron, calcium, and vitamin D. They are important for healthy development.  Take medicines only as directed by your health care provider. Read labels and ask a pharmacist or your health care provider whether over-the-counter medicines, supplements, and prescription drugs are safe to take during pregnancy.  Activities  Be physically active as advised by your health care provider. Ask your health care provider to recommend activities that are safe for you to do, such as walking or swimming.  Do not participate in strenuous or extreme sports.  Lifestyle  Do not drink alcohol.  Do not use any tobacco products, including cigarettes, chewing tobacco, or electronic cigarettes. If you need help quitting, ask your health care provider.  Do not use illegal drugs.  Safety  Avoid exposure to mercury, lead, or other heavy metals. Ask your health care provider about common sources of these heavy metals.  Avoid listeria infection during pregnancy. Follow these precautions: ? Do not eat soft cheeses or deli meats. ? Do not eat hot dogs unless they have been warmed up to the point of steaming, such as in the microwave oven. ? Do not  drink unpasteurized milk.  Avoid toxoplasmosis infection during pregnancy. Follow these precautions: ? Do not change your cat's litter box, if you have a cat. Ask someone else to do this for you. ? Wear gardening gloves while working in the yard.  General Instructions  Keep all follow-up visits as directed by your health care provider. This is important. This includes prenatal care and screening tests.  Manage any chronic health conditions. Work closely with your health care provider to keep conditions, such as diabetes, under control.  How do I know if my baby is developing well? At each prenatal visit, your health care provider will do several different tests to check on your health and keep track of your baby's development. These include:  Fundal height. ? Your health care provider will measure your growing belly from top to bottom using a tape measure. ? Your health care provider will also feel your belly  to determine your baby's position.  Heartbeat. ? An ultrasound in the first trimester can confirm pregnancy and show a heartbeat, depending on how far along you are. ? Your health care provider will check your baby's heart rate at every prenatal visit. ? As you get closer to your delivery date, you may have regular fetal heart rate monitoring to make sure that your baby is not in distress.  Second trimester ultrasound. ? This ultrasound checks your baby's development. It also indicates your baby's gender.  What should I do if I have concerns about my baby's development? Always talk with your health care provider about any concerns that you may have. This information is not intended to replace advice given to you by your health care provider. Make sure you discuss any questions you have with your health care provider. Document Released: 07/17/2007 Document Revised: 07/06/2015 Document Reviewed: 07/07/2013 Elsevier Interactive Patient Education  2018 [employer]. Round Ligament  Pain The round ligament is a cord of muscle and tissue that helps to support the uterus. It can become a source of pain during pregnancy if it becomes stretched or twisted as the baby grows. The pain usually begins in the second trimester of pregnancy, and it can come and go until the baby is delivered. It is not a serious problem, and it does not cause harm to the baby. Round ligament pain is usually a short, sharp, and pinching pain, but it can also be a dull, lingering, and aching pain. The pain is felt in the lower side of the abdomen or in the groin. It usually starts deep in the groin and moves up to the outside of the hip area. Pain can occur with:  A sudden change in position.  Rolling over in bed.  Coughing or sneezing.  Physical activity.  Follow these instructions at home: Watch your condition for any changes. Take these steps to help with your pain:  When the pain starts, relax. Then try: ? Sitting down. ? Flexing your knees up to your abdomen. ? Lying on your side with one pillow under your abdomen and another pillow between your legs. ? Sitting in a warm bath for 15-20 minutes or until the pain goes away.  Take over-the-counter and prescription medicines only as told by your health care provider.  Move slowly when you sit and stand.  Avoid long walks if they cause pain.  Stop or lessen your physical activities if they cause pain.  Contact a health care provider if:  Your pain does not go away with treatment.  You feel pain in your back that you did not have before.  Your medicine is not helping. Get help right away if:  You develop a fever or chills.  You develop uterine contractions.  You develop vaginal bleeding.  You develop nausea or vomiting.  You develop diarrhea.  You have pain when you urinate. This information is not intended to replace advice given to you by your health care provider. Make sure you discuss any questions you have with your  health care provider. Document Released: 11/07/2007 Document Revised: 07/06/2015 Document Reviewed: 04/06/2014 Elsevier Interactive Patient Education  2018 [employer] Glucose Tolerance Test The glucose tolerance test (GTT) is one of several tests used to diagnose diabetes mellitus. The GTT is a blood test, and it may include a urine test as well. The GTT checks to see how your body processes sugar (glucose). For this test, you will consume a drink containing a high level  of glucose. Your blood glucose levels will be checked before you consume the drink and then again 1, 2, 3, and possibly 4 hours after you consume it. Your health care provider may recommend that you have the GTT if you:  Have a family history of diabetes.  Are very overweight (obese).  Have experienced infections that keep coming back.  Have had numerous cuts or wounds that did not heal quickly, especially on your legs and feet.  Are a woman and have a history of giving birth to very large babies or a history of repeated fetal loss (stillbirth).  Have had glucose in your urine or high blood sugar: ? During pregnancy. ? After a heart attack, surgery, or prolonged periods of high stress.  The GTT lasts 3-4 hours. Other than the glucose solution, you will not be allowed to eat or drink anything during the test. You must remain at the testing location to make sure that your blood and urine samples are taken on time. How do I prepare for this test? Eat normally for 3 days prior to the GTT test, including having plenty of carbohydrate-rich foods. Do not eat or drink anything except water during the final 12 hours before the test. You should not smoke or exercise during the test. In addition, your health care provider may ask you to stop taking certain medicines before the test. What do the results mean? It is your responsibility to obtain your test results. Ask the lab or department performing the test when and how you will  get your results. Contact your health care provider to discuss any questions you have about your results. Range of Normal Values Ranges for normal values may vary among different labs and hospitals. You should always check with your health care provider after having lab work or other tests done to discuss whether your values are considered within normal limits. Normal levels of blood glucose are as follows:  Fasting: less than 110 mg/dL or less than 6.1 mmol/L (SI units).  1 hour after consuming the glucose drink: less than 200 mg/dL or less than 11.1 mmol/L.  2 hours after consuming the glucose drink: less than 140 mg/dL or less than 7.8 mmol/L.  3 hours after consuming the glucose drink: 70-115 mg/dL or less than 6.4 mmol/L.  4 hours after consuming the glucose drink: 70-115 mg/dL or less than 6.4 mmol/L.  The normal result for the urine test is negative, meaning that glucose is absent from your urine. Some substances can interfere with GTT results. These may include:  Blood pressure and heart failure medicines, including beta blockers, furosemide, and thiazides.  Anti-inflammatory medicines, including aspirin.  Nicotine.  Some psychiatric medicines.  Oral contraceptives.  Diuretics or corticosteroids.  Meaning of Results Outside Normal Value Ranges GTT test results that are above normal values may indicate health problems, such as:  Diabetes mellitus.  Acute stress response.  Cushing syndrome.  Tumors such as pheochromocytoma or glucagonoma.  Chronic renal failure.  Pancreatitis.  Hyperthyroidism.  Current infection.  Discuss your test results with your health care provider. He or she will use the results to make a diagnosis and determine a treatment plan that is right for you. Talk with your health care provider to discuss your results, treatment options, and if necessary, the need for more tests. Talk with your health care provider if you have any questions  about your results. This information is not intended to replace advice given to you by your health care provider.  Make sure you discuss any questions you have with your health care provider. Document Released: 02/21/2004 Document Revised: 10/03/2015 Document Reviewed: 06/04/2013 Elsevier Interactive Patient Education  2017 [place]. Common Medications Safe in Pregnancy  Acne:      Constipation:  Benzoyl Peroxide     Colace  Clindamycin      Dulcolax Suppository  Topica Erythromycin     Fibercon  Salicylic Acid      Metamucil         Miralax AVOID:        Senakot   Accutane    Cough:  Retin-A       Cough Drops  Tetracycline      Phenergan w/ Codeine if Rx  Minocycline      Robitussin (Plain & DM)  Antibiotics:     Crabs/Lice:  Ceclor       RID  Cephalosporins    AVOID:  E-Mycins      Kwell  Keflex  Macrobid/Macrodantin   Diarrhea:  Penicillin      Kao-Pectate  Zithromax      Imodium AD         PUSH FLUIDS AVOID:       Cipro     Fever:  Tetracycline      Tylenol (Regular or Extra  Minocycline       Strength)  Levaquin      Extra Strength-Do not          Exceed 8 tabs/24 hrs Caffeine:        '200mg'$ /day (equiv. To 1 cup of coffee or  approx. 3 12 oz sodas)         Gas: Cold/Hayfever:       Gas-X  Benadryl      Mylicon  Claritin       Phazyme  **Claritin-D        Chlor-Trimeton    Headaches:  Dimetapp      ASA-Free Excedrin  Drixoral-Non-Drowsy     Cold Compress  Mucinex (Guaifenasin)     Tylenol (Regular or Extra  Sudafed/Sudafed-12 Hour     Strength)  **Sudafed PE Pseudoephedrine   Tylenol Cold & Sinus     Vicks Vapor Rub  Zyrtec  **AVOID if Problems With Blood Pressure         Heartburn: Avoid lying down for at least 1 hour after meals  Aciphex      Maalox     Rash:  Milk of Magnesia     Benadryl    Mylanta       1% Hydrocortisone Cream  Pepcid  Pepcid Complete   Sleep Aids:  Prevacid      Ambien   Prilosec       Benadryl  Rolaids       Chamomile  Tea  Tums (Limit 4/day)     Unisom  Zantac       Tylenol PM         Warm milk-add vanilla or  Hemorrhoids:       Sugar for taste  Anusol/Anusol H.C.  (RX: Analapram 2.5%)  Sugar Substitutes:  Hydrocortisone OTC     Ok in moderation  Preparation H      Tucks        Vaseline lotion applied to tissue with wiping    Herpes:     Throat:  Acyclovir      Oragel  Famvir  Valtrex     Vaccines:         Flu Shot  Leg Cramps:       *Gardasil  Benadryl      Hepatitis A         Hepatitis B Nasal Spray:       Pneumovax  Saline Nasal Spray     Polio Booster         Tetanus Nausea:       Tuberculosis test or PPD  Vitamin B6 25 mg TID   AVOID:    Dramamine      *Gardasil  Emetrol       Live Poliovirus  Ginger Root 250 mg QID    MMR (measles, mumps &  High Complex Carbs @ Bedtime    rebella)  Sea Bands-Accupressure    Varicella (Chickenpox)  Unisom 1/2 tab TID     *No known complications           If received before Pain:         Known pregnancy;   Darvocet       Resume series after  Lortab        Delivery  Percocet    Yeast:   Tramadol      Femstat  Tylenol 3      Gyne-lotrimin  Ultram       Monistat  Vicodin           MISC:         All Sunscreens           Hair Coloring/highlights          Insect Repellant's          (Including DEET)         Mystic Tans

## 2016-09-16 NOTE — Progress Notes (Signed)
ROB- Early gtt. H/a- 2 x a week. Tylenol not helping.

## 2016-09-16 NOTE — Progress Notes (Signed)
ROB, doing well. Continues to have nausea. Self help interventions reviewed. Sample of Bonjesta given, prescription sent to pharmacy on file. She will have early GTT due to BMI in 2 wks. ROB in 4 wks with anatomy u/s. PTL precautions reviewed.   Doreene BurkeAnnie Suman Trivedi, CNM

## 2016-09-23 ENCOUNTER — Other Ambulatory Visit: Payer: Self-pay | Admitting: Certified Nurse Midwife

## 2016-09-23 ENCOUNTER — Other Ambulatory Visit: Payer: Self-pay

## 2016-09-23 ENCOUNTER — Other Ambulatory Visit: Payer: Managed Care, Other (non HMO)

## 2016-09-24 ENCOUNTER — Encounter: Payer: Self-pay | Admitting: Certified Nurse Midwife

## 2016-09-24 LAB — GLUCOSE, 1 HOUR GESTATIONAL: GESTATIONAL DIABETES SCREEN: 93 mg/dL (ref 65–139)

## 2016-10-15 ENCOUNTER — Ambulatory Visit (INDEPENDENT_AMBULATORY_CARE_PROVIDER_SITE_OTHER): Payer: Managed Care, Other (non HMO)

## 2016-10-15 ENCOUNTER — Ambulatory Visit (INDEPENDENT_AMBULATORY_CARE_PROVIDER_SITE_OTHER): Payer: Managed Care, Other (non HMO) | Admitting: Certified Nurse Midwife

## 2016-10-15 VITALS — BP 112/75 | HR 91 | Wt 235.8 lb

## 2016-10-15 DIAGNOSIS — Z3482 Encounter for supervision of other normal pregnancy, second trimester: Secondary | ICD-10-CM

## 2016-10-15 LAB — POCT URINALYSIS DIPSTICK
Bilirubin, UA: NEGATIVE
Glucose, UA: NEGATIVE
KETONES UA: NEGATIVE
Leukocytes, UA: NEGATIVE
Nitrite, UA: NEGATIVE
PROTEIN UA: NEGATIVE
RBC UA: NEGATIVE
SPEC GRAV UA: 1.01 (ref 1.010–1.025)
UROBILINOGEN UA: 0.2 U/dL
pH, UA: 6 (ref 5.0–8.0)

## 2016-10-15 NOTE — Progress Notes (Signed)
ROB-Pt doing well, no questions or concerns. Anatomy scan today, normal but incomplete. Reviewed red flag symptoms and when to call. RTC x 1-2 weeks for follow up US due to incomplete anatomy scan. RTC x 4 weeks for ROB or sooner if needed.   ULTRASOUND REPORT  Location: [facility] Date of Service: 10/15/16  Indications:Anatomy U/S Findings:  Alicia JimSingleton intrauterine pregnancy is visualized with FHR at 141 BPM. Biometrics give an (U/S) Gestational age of 28 4/7 weeks and an (U/S) EDD of 02/28/17; this correlates with the clinically established EDD of 03/01/17.  Fetal presentation is Breech.  EFW: 364g (13 oz). Placenta: Anterior, grade 0, 3.2 cm from internal os. AFI: Adequate with MVP of 4cm.  Anatomic survey is incomplete and normal; Gender - not visualized due to fetal position.  Anatomy needed to complete exam: Gender, Spine, Profile, heels, fifth.  Ovaries are not seen. Survey of the adnexa demonstrates no adnexal masses. There is no free peritoneal fluid in the cul de sac.  Impression: 1. 20 4/7 week Viable Singleton Intrauterine pregnancy by U/S. 2. (U/S) EDD is consistent with Clinically established (LMP) EDD of 03/01/17. 3. Incomplete Anatomy Scan  Recommendations: 1.Clinical correlation with the patient's History and Physical Exam. 2. Return in 1-2 weeks for anatomy follow up

## 2016-10-15 NOTE — Patient Instructions (Signed)

## 2016-10-21 ENCOUNTER — Other Ambulatory Visit: Payer: Self-pay | Admitting: Certified Nurse Midwife

## 2016-10-21 DIAGNOSIS — IMO0002 Reserved for concepts with insufficient information to code with codable children: Secondary | ICD-10-CM

## 2016-10-21 DIAGNOSIS — Z0489 Encounter for examination and observation for other specified reasons: Secondary | ICD-10-CM

## 2016-10-28 ENCOUNTER — Ambulatory Visit (INDEPENDENT_AMBULATORY_CARE_PROVIDER_SITE_OTHER): Payer: Managed Care, Other (non HMO)

## 2016-10-28 DIAGNOSIS — Z0489 Encounter for examination and observation for other specified reasons: Secondary | ICD-10-CM

## 2016-10-28 DIAGNOSIS — Z048 Encounter for examination and observation for other specified reasons: Secondary | ICD-10-CM

## 2016-10-28 DIAGNOSIS — IMO0002 Reserved for concepts with insufficient information to code with codable children: Secondary | ICD-10-CM

## 2016-10-30 ENCOUNTER — Other Ambulatory Visit: Payer: Self-pay | Admitting: Obstetrics and Gynecology

## 2016-10-30 DIAGNOSIS — IMO0002 Reserved for concepts with insufficient information to code with codable children: Secondary | ICD-10-CM

## 2016-10-30 DIAGNOSIS — Z0489 Encounter for examination and observation for other specified reasons: Secondary | ICD-10-CM

## 2016-11-11 ENCOUNTER — Ambulatory Visit (INDEPENDENT_AMBULATORY_CARE_PROVIDER_SITE_OTHER): Payer: Managed Care, Other (non HMO)

## 2016-11-11 ENCOUNTER — Ambulatory Visit (INDEPENDENT_AMBULATORY_CARE_PROVIDER_SITE_OTHER): Payer: Managed Care, Other (non HMO) | Admitting: Certified Nurse Midwife

## 2016-11-11 VITALS — BP 119/74 | HR 93 | Wt 242.3 lb

## 2016-11-11 DIAGNOSIS — O4440 Low lying placenta NOS or without hemorrhage, unspecified trimester: Secondary | ICD-10-CM | POA: Insufficient documentation

## 2016-11-11 DIAGNOSIS — Z3492 Encounter for supervision of normal pregnancy, unspecified, second trimester: Secondary | ICD-10-CM

## 2016-11-11 DIAGNOSIS — Z0489 Encounter for examination and observation for other specified reasons: Secondary | ICD-10-CM | POA: Diagnosis not present

## 2016-11-11 DIAGNOSIS — IMO0002 Reserved for concepts with insufficient information to code with codable children: Secondary | ICD-10-CM

## 2016-11-11 LAB — POCT URINALYSIS DIPSTICK
Bilirubin, UA: NEGATIVE
Blood, UA: NEGATIVE
Glucose, UA: NEGATIVE
Ketones, UA: NEGATIVE
Nitrite, UA: NEGATIVE
PROTEIN UA: NEGATIVE
SPEC GRAV UA: 1.01 (ref 1.010–1.025)
UROBILINOGEN UA: 0.2 U/dL
pH, UA: 6.5 (ref 5.0–8.0)

## 2016-11-11 NOTE — Patient Instructions (Signed)

## 2016-11-11 NOTE — Progress Notes (Signed)
ROB, doing well. Follow up anatomy today. See below. Placental remains low lying. Plan repeat u/s  wks. She endourses good fetal movement. Denies any vag bleeding. She is to continue nothing in vagina. Follow up 4 wks for 1 hr GTT.   Doreene Burke, CNM  ULTRASOUND REPORT  Location: [facility] Date of Service: 11/11/16  Indications: F/U [facility] Findings:  Singleton intrauterine pregnancy is visualized with FHR at 135 BPM.  Fetal presentation is variable, transverse with head to maternal left at beginning of exam and breech, spine posterior at end of scan. Placenta: Anterior, grade 1 and marginal previa with the tip at the cervical os. AFI: Subjectively adequate with an MVP of 3.3 cm.  Anatomic survey is now complete with acquisition of gender, which is female. Also observed today were the lateral ventricle, stomach, bladder and kidneys and all appear WNL. Gender - Female.    Impression: 1. Anatomy scan now complete with acquisition of gender; Female. All other anatomy surveyed today appears WNL. 2. Marginal placenta previa with the tip of the anterior placenta at the cervical os.  Recommendations: 1.Clinical correlation with the patient's History and Physical Exam. 2. Reassess placental location at 28 weeks.  Revonda Humphrey, RDMS, RVT

## 2016-11-11 NOTE — Progress Notes (Signed)
ROB- pt had follow up anatomy scan done today, she is feeling ok

## 2016-11-29 ENCOUNTER — Encounter: Payer: Self-pay | Admitting: Obstetrics and Gynecology

## 2016-12-09 ENCOUNTER — Ambulatory Visit (INDEPENDENT_AMBULATORY_CARE_PROVIDER_SITE_OTHER): Payer: Managed Care, Other (non HMO) | Admitting: Certified Nurse Midwife

## 2016-12-09 ENCOUNTER — Other Ambulatory Visit: Payer: Managed Care, Other (non HMO)

## 2016-12-09 VITALS — BP 123/87 | HR 96 | Wt 248.2 lb

## 2016-12-09 DIAGNOSIS — Z23 Encounter for immunization: Secondary | ICD-10-CM | POA: Diagnosis not present

## 2016-12-09 DIAGNOSIS — Z131 Encounter for screening for diabetes mellitus: Secondary | ICD-10-CM

## 2016-12-09 DIAGNOSIS — Z3483 Encounter for supervision of other normal pregnancy, third trimester: Secondary | ICD-10-CM

## 2016-12-09 LAB — POCT URINALYSIS DIPSTICK
BILIRUBIN UA: NEGATIVE
Blood, UA: NEGATIVE
GLUCOSE UA: NEGATIVE
Ketones, UA: NEGATIVE
LEUKOCYTES UA: NEGATIVE
NITRITE UA: NEGATIVE
PH UA: 7 (ref 5.0–8.0)
Protein, UA: NEGATIVE
Spec Grav, UA: 1.015 (ref 1.010–1.025)
Urobilinogen, UA: 0.2 E.U./dL

## 2016-12-09 NOTE — Progress Notes (Signed)
Pt is here for an ROB.States right calf had a red spot and warm area on 11/29/16. It has since resolved.

## 2016-12-09 NOTE — Progress Notes (Signed)
ROB-Pt doing well, no questions or concerns. Blood transfusion consent signed. CBC and glucola today. Flu shot given. Desires NCB, plans breastfeeding, and vasectomy as PP contraception. Encouraged sibling class and childbirth schedule given. Anticipatory guidance regarding course of prenatal care. Reviewed red flag symptoms and when to call.

## 2016-12-09 NOTE — Patient Instructions (Signed)
[place] 2018 Prenatal Education Class Schedule Register at [email or web address] in the [place] or call Kerman Passey at [phone] 9:00a-5:00p M-F  Childbirth Preparation Certified Childbirth Educators teach this 5 week course.  Expectant parents are encouraged to take this class in their 3rd trimester, completing it by their 35-36th week. Meets in [facility], Lower Level.  Mondays Thursdays  7:00-9:00 p 7:00-9:00 p  July 23 - August 20 July 19 - August 16  September 17 - October 15 September 6 -October 4  November 5 - December 3 October 25 - November 29   No Class on Thanksgiving Day -November 22  Childbirth Preparation Refresher Course For those who have previously attended Prepared Childbirth Preparation classes, this class in incorporated into the 3rd and 4th classes in the Monday night childbirth series.  Course meets in the [facility]. Lower Level from 7:00p - 9:00p  August 6 & 13  October 1 & 8  November 19 & 26   Weekend Childbirth Waymon Amato Classes are held Saturday & Sunday, 1:00 5:00p Course meets in [facility], [place] Level  August 4 & 5  November 3 & 4    The [facility] Tours Free tours are held on the third Sunday of each month at 3 pm.  The tour meets in the third floor waiting area and will take approximately 30 minutes.  Tours are also included in Childbirth class series as well as Brother/Sister class.  An online virtual tour can be seen at [email or web address].         Breastfeeding & Infant Nutrition The course incorporates returning to work or school.  Breast milk collection and storage with basic breastfeeding and infant nutrition. This two-class course is held the 2nd and 3rd Tuesday of each month from 7:00 -9:00 pm.  Course meets in the [facility] Lower level  June 12 & 19 July-No Class  August 14 & 21 September 11 &18  September 11 & 18 October 9 &16  November 13 &  20 December 11 & 18   [employer] welcomes any mother for a social outing with other Moms to share experiences and challenges in an informal setting.  Meets the 1st Thursday and 3rd Thursday 11:30a-1:00 pm of each month in [facility] 3rd floor classroom.  No registration required.  Newborn Essentials This course covers bathing, diapering, swaddling and more with practice on lifelike dolls.  Participants will also learn safety tips and infant CPR (Not for certification).  It is held the 1st Wednesday of each month from 7:00p-9:00p in the [facility], Lower level.  June 6 July- No Class  August 1 September 5  October 3 November 7  December 7    Preparing Big Brother & Sister This one session course prepares children and their parents for the arrival of a new baby.  It is held on the 1st Tuesday of each month from 6:30p - 8:00p. Course meets in the [facility], Lower level.  July-No Class August 7  September 4 October 2  November 6 December 4   [place] for [employer] Dads This nationally acclaimed class helps expecting and new dads with the basic skills and confidence to bond with their infants, support their mates, and provide a safe and healthy home environment for their new family. Classes are held the 2nd Saturday of every month from 9:00a - 12:00 noon.  Course meets in the [facility] Lower level.  June 9 August 11  October 13 No Class in December  Third Trimester of Pregnancy The third trimester is from week 28 through week 40 (months 7 through 9). The third trimester is a time when the unborn baby (fetus) is growing rapidly. At the end of the ninth month, the fetus is about 20 inches in length and weighs 6-10 pounds. Body changes during your third trimester Your body will continue to go through many changes during pregnancy. The changes vary from woman to woman. During the third trimester:  Your weight will continue to increase. You can expect to gain  25-35 pounds (11-16 kg) by the end of the pregnancy.  You may begin to get stretch marks on your hips, abdomen, and breasts.  You may urinate more often because the fetus is moving lower into your pelvis and pressing on your bladder.  You may develop or continue to have heartburn. This is caused by increased hormones that slow down muscles in the digestive tract.  You may develop or continue to have constipation because increased hormones slow digestion and cause the muscles that push waste through your intestines to relax.  You may develop hemorrhoids. These are swollen veins (varicose veins) in the rectum that can itch or be painful.  You may develop swollen, bulging veins (varicose veins) in your legs.  You may have increased body aches in the pelvis, back, or thighs. This is due to weight gain and increased hormones that are relaxing your joints.  You may have changes in your hair. These can include thickening of your hair, rapid growth, and changes in texture. Some women also have hair loss during or after pregnancy, or hair that feels dry or thin. Your hair will most likely return to normal after your baby is born.  Your breasts will continue to grow and they will continue to become tender. A yellow fluid (colostrum) may leak from your breasts. This is the first milk you are producing for your baby.  Your belly button may stick out.  You may notice more swelling in your hands, face, or ankles.  You may have increased tingling or numbness in your hands, arms, and legs. The skin on your belly may also feel numb.  You may feel short of breath because of your expanding uterus.  You may have more problems sleeping. This can be caused by the size of your belly, increased need to urinate, and an increase in your body's metabolism.  You may notice the fetus "dropping," or moving lower in your abdomen (lightening).  You may have increased vaginal discharge.  You may notice your joints  feel loose and you may have pain around your pelvic bone.  What to expect at prenatal visits You will have prenatal exams every 2 weeks until week 36. Then you will have weekly prenatal exams. During a routine prenatal visit:  You will be weighed to make sure you and the baby are growing normally.  Your blood pressure will be taken.  Your abdomen will be measured to track your baby's growth.  The fetal heartbeat will be listened to.  Any test results from the previous visit will be discussed.  You may have a cervical check near your due date to see if your cervix has softened or thinned (effaced).  You will be tested for Group B streptococcus. This happens between 35 and 37 weeks.  Your health care provider may ask you:  What your birth plan is.  How you are feeling.  If you are feeling the baby move.  If you have had any abnormal symptoms, such as leaking fluid, bleeding, severe headaches, or abdominal cramping.  If you are using any tobacco products, including cigarettes, chewing tobacco, and electronic cigarettes.  If you have any questions.  Other tests or screenings that may be performed during your third trimester include:  Blood tests that check for low iron levels (anemia).  Fetal testing to check the health, activity level, and growth of the fetus. Testing is done if you have certain medical conditions or if there are problems during the pregnancy.  Nonstress test (NST). This test checks the health of your baby to make sure there are no signs of problems, such as the baby not getting enough oxygen. During this test, a belt is placed around your belly. The baby is made to move, and its heart rate is monitored during movement.  What is false labor? False labor is a condition in which you feel small, irregular tightenings of the muscles in the womb (contractions) that usually go away with rest, changing position, or drinking water. These are called Braxton Hicks  contractions. Contractions may last for hours, days, or even weeks before true labor sets in. If contractions come at regular intervals, become more frequent, increase in intensity, or become painful, you should see your health care provider. What are the signs of labor?  Abdominal cramps.  Regular contractions that start at 10 minutes apart and become stronger and more frequent with time.  Contractions that start on the top of the uterus and spread down to the lower abdomen and back.  Increased pelvic pressure and dull back pain.  A watery or bloody mucus discharge that comes from the vagina.  Leaking of amniotic fluid. This is also known as your "water breaking." It could be a slow trickle or a gush. Let your health care provider know if it has a color or strange odor. If you have any of these signs, call your health care provider right away, even if it is before your due date. Follow these instructions at home: Medicines  Follow your health care provider's instructions regarding medicine use. Specific medicines may be either safe or unsafe to take during pregnancy.  Take a prenatal vitamin that contains at least 600 micrograms (mcg) of folic acid.  If you develop constipation, try taking a stool softener if your health care provider approves. Eating and drinking  Eat a balanced diet that includes fresh fruits and vegetables, whole grains, good sources of protein such as meat, eggs, or tofu, and low-fat dairy. Your health care provider will help you determine the amount of weight gain that is right for you.  Avoid raw meat and uncooked cheese. These carry germs that can cause birth defects in the baby.  If you have low calcium intake from food, talk to your health care provider about whether you should take a daily calcium supplement.  Eat four or five small meals rather than three large meals a day.  Limit foods that are high in fat and processed sugars, such as fried and sweet  foods.  To prevent constipation: ? Drink enough fluid to keep your urine clear or pale yellow. ? Eat foods that are high in fiber, such as fresh fruits and vegetables, whole grains, and beans. Activity  Exercise only as directed by your health care provider. Most women can continue their usual exercise routine during pregnancy. Try to exercise for 30 minutes at least 5 days  a week. Stop exercising if you experience uterine contractions.  Avoid heavy lifting.  Do not exercise in extreme heat or humidity, or at high altitudes.  Wear low-heel, comfortable shoes.  Practice good posture.  You may continue to have sex unless your health care provider tells you otherwise. Relieving pain and discomfort  Take frequent breaks and rest with your legs elevated if you have leg cramps or low back pain.  Take warm sitz baths to soothe any pain or discomfort caused by hemorrhoids. Use hemorrhoid cream if your health care provider approves.  Wear a good support bra to prevent discomfort from breast tenderness.  If you develop varicose veins: ? Wear support pantyhose or compression stockings as told by your healthcare provider. ? Elevate your feet for 15 minutes, 3-4 times a day. Prenatal care  Write down your questions. Take them to your prenatal visits.  Keep all your prenatal visits as told by your health care provider. This is important. Safety  Wear your seat belt at all times when driving.  Make a list of emergency phone numbers, including numbers for family, friends, the hospital, and police and fire departments. General instructions  Avoid cat litter boxes and soil used by cats. These carry germs that can cause birth defects in the baby. If you have a cat, ask someone to clean the litter box for you.  Do not travel far distances unless it is absolutely necessary and only with the approval of your health care provider.  Do not use hot tubs, steam rooms, or saunas.  Do not drink  alcohol.  Do not use any products that contain nicotine or tobacco, such as cigarettes and e-cigarettes. If you need help quitting, ask your health care provider.  Do not use any medicinal herbs or unprescribed drugs. These chemicals affect the formation and growth of the baby.  Do not douche or use tampons or scented sanitary pads.  Do not cross your legs for long periods of time.  To prepare for the arrival of your baby: ? Take prenatal classes to understand, practice, and ask questions about labor and delivery. ? Make a trial run to the hospital. ? Visit the hospital and tour the maternity area. ? Arrange for maternity or paternity leave through employers. ? Arrange for family and friends to take care of pets while you are in the hospital. ? Purchase a rear-facing car seat and make sure you know how to install it in your car. ? Pack your hospital bag. ? Prepare the baby's nursery. Make sure to remove all pillows and stuffed animals from the baby's crib to prevent suffocation.  Visit your dentist if you have not gone during your pregnancy. Use a soft toothbrush to brush your teeth and be gentle when you floss. Contact a health care provider if:  You are unsure if you are in labor or if your water has broken.  You become dizzy.  You have mild pelvic cramps, pelvic pressure, or nagging pain in your abdominal area.  You have lower back pain.  You have persistent nausea, vomiting, or diarrhea.  You have an unusual or bad smelling vaginal discharge.  You have pain when you urinate. Get help right away if:  Your water breaks before 37 weeks.  You have regular contractions less than 5 minutes apart before 37 weeks.  You have a fever.  You are leaking fluid from your vagina.  You have spotting or bleeding from your vagina.  You have severe abdominal pain  or cramping.  You have rapid weight loss or weight gain.  You have shortness of breath with chest pain.  You notice  sudden or extreme swelling of your face, hands, ankles, feet, or legs.  Your baby makes fewer than 10 movements in 2 hours.  You have severe headaches that do not go away when you take medicine.  You have vision changes. Summary  The third trimester is from week 28 through week 40, months 7 through 9. The third trimester is a time when the unborn baby (fetus) is growing rapidly.  During the third trimester, your discomfort may increase as you and your baby continue to gain weight. You may have abdominal, leg, and back pain, sleeping problems, and an increased need to urinate.  During the third trimester your breasts will keep growing and they will continue to become tender. A yellow fluid (colostrum) may leak from your breasts. This is the first milk you are producing for your baby.  False labor is a condition in which you feel small, irregular tightenings of the muscles in the womb (contractions) that eventually go away. These are called Braxton Hicks contractions. Contractions may last for hours, days, or even weeks before true labor sets in.  Signs of labor can include: abdominal cramps; regular contractions that start at 10 minutes apart and become stronger and more frequent with time; watery or bloody mucus discharge that comes from the vagina; increased pelvic pressure and dull back pain; and leaking of amniotic fluid. This information is not intended to replace advice given to you by your health care provider. Make sure you discuss any questions you have with your health care provider. Document Released: 01/22/2001 Document Revised: 07/06/2015 Document Reviewed: 03/31/2012 Elsevier Interactive Patient Education  2017 [employer].

## 2016-12-09 NOTE — Addendum Note (Signed)
Addended by: Rosine BeatLONTZ, AMY L on: 12/09/2016 02:36 PM   Modules accepted: Orders

## 2016-12-10 LAB — CBC WITH DIFFERENTIAL/PLATELET
BASOS ABS: 0.1 10*3/uL (ref 0.0–0.2)
Basos: 0 %
EOS (ABSOLUTE): 0.1 10*3/uL (ref 0.0–0.4)
Eos: 1 %
HEMOGLOBIN: 12.2 g/dL (ref 11.1–15.9)
Hematocrit: 37.6 % (ref 34.0–46.6)
IMMATURE GRANS (ABS): 0.5 10*3/uL — AB (ref 0.0–0.1)
Immature Granulocytes: 4 %
LYMPHS: 13 %
Lymphocytes Absolute: 1.8 10*3/uL (ref 0.7–3.1)
MCH: 29.5 pg (ref 26.6–33.0)
MCHC: 32.4 g/dL (ref 31.5–35.7)
MCV: 91 fL (ref 79–97)
MONOCYTES: 7 %
Monocytes Absolute: 1 10*3/uL — ABNORMAL HIGH (ref 0.1–0.9)
NEUTROS ABS: 10.5 10*3/uL — AB (ref 1.4–7.0)
Neutrophils: 75 %
Platelets: 246 10*3/uL (ref 150–379)
RBC: 4.14 x10E6/uL (ref 3.77–5.28)
RDW: 13.8 % (ref 12.3–15.4)
WBC: 14 10*3/uL — ABNORMAL HIGH (ref 3.4–10.8)

## 2016-12-10 LAB — GLUCOSE, 1 HOUR GESTATIONAL: GESTATIONAL DIABETES SCREEN: 101 mg/dL (ref 65–139)

## 2016-12-17 ENCOUNTER — Other Ambulatory Visit: Payer: Self-pay | Admitting: Certified Nurse Midwife

## 2016-12-17 DIAGNOSIS — O4420 Partial placenta previa NOS or without hemorrhage, unspecified trimester: Secondary | ICD-10-CM

## 2016-12-24 ENCOUNTER — Ambulatory Visit (INDEPENDENT_AMBULATORY_CARE_PROVIDER_SITE_OTHER): Payer: Managed Care, Other (non HMO) | Admitting: Certified Nurse Midwife

## 2016-12-24 ENCOUNTER — Encounter: Payer: Self-pay | Admitting: Certified Nurse Midwife

## 2016-12-24 ENCOUNTER — Other Ambulatory Visit: Payer: Self-pay

## 2016-12-24 VITALS — BP 121/82 | HR 101 | Wt 255.5 lb

## 2016-12-24 DIAGNOSIS — Z3483 Encounter for supervision of other normal pregnancy, third trimester: Secondary | ICD-10-CM

## 2016-12-24 LAB — POCT URINALYSIS DIPSTICK
Bilirubin, UA: NEGATIVE
Blood, UA: NEGATIVE
GLUCOSE UA: NEGATIVE
KETONES UA: NEGATIVE
LEUKOCYTES UA: NEGATIVE
Nitrite, UA: NEGATIVE
Protein, UA: NEGATIVE
SPEC GRAV UA: 1.02 (ref 1.010–1.025)
UROBILINOGEN UA: 0.2 U/dL
pH, UA: 6 (ref 5.0–8.0)

## 2016-12-24 NOTE — Patient Instructions (Signed)

## 2016-12-24 NOTE — Progress Notes (Signed)
Body mass index is 43.86 kg/m. ROB doing well, She somethimes has swelling in her right leg more than her left. Discussed use of sequential stocking, breaks q 3 hrs, drinking 8-10 glasses of water a day. We discussed her current BMI and need for anesthesia consult at a BMI of 45, she verbalizes understanding and agrees. Encouraged exercise and healthy diet. She verbalizes understanding. Follow up 2 wks .   Doreene BurkeAnnie Millie Forde, CNM

## 2016-12-31 ENCOUNTER — Encounter: Payer: Self-pay | Admitting: Obstetrics and Gynecology

## 2017-01-06 ENCOUNTER — Ambulatory Visit (INDEPENDENT_AMBULATORY_CARE_PROVIDER_SITE_OTHER): Payer: Managed Care, Other (non HMO) | Admitting: Certified Nurse Midwife

## 2017-01-06 ENCOUNTER — Ambulatory Visit (INDEPENDENT_AMBULATORY_CARE_PROVIDER_SITE_OTHER): Payer: Managed Care, Other (non HMO)

## 2017-01-06 ENCOUNTER — Encounter: Payer: Self-pay | Admitting: Certified Nurse Midwife

## 2017-01-06 VITALS — BP 121/83 | HR 98 | Wt 256.2 lb

## 2017-01-06 DIAGNOSIS — Z3483 Encounter for supervision of other normal pregnancy, third trimester: Secondary | ICD-10-CM

## 2017-01-06 DIAGNOSIS — O4420 Partial placenta previa NOS or without hemorrhage, unspecified trimester: Secondary | ICD-10-CM | POA: Diagnosis not present

## 2017-01-06 DIAGNOSIS — Z23 Encounter for immunization: Secondary | ICD-10-CM | POA: Diagnosis not present

## 2017-01-06 LAB — POCT URINALYSIS DIPSTICK
Bilirubin, UA: NEGATIVE
Glucose, UA: NEGATIVE
Ketones, UA: NEGATIVE
LEUKOCYTES UA: NEGATIVE
NITRITE UA: NEGATIVE
PH UA: 6 (ref 5.0–8.0)
Spec Grav, UA: 1.02 (ref 1.010–1.025)
UROBILINOGEN UA: 0.2 U/dL

## 2017-01-06 NOTE — Progress Notes (Signed)
ROB- Pt would like to discuss weight gain and birthing plan, tdap today

## 2017-01-06 NOTE — Patient Instructions (Signed)

## 2017-01-06 NOTE — Progress Notes (Signed)
ROB, doing well. Endorses good fetal movement. No complaints. Ultrasound today for placenta location.  Placenta is no longer a marginal previa at 7.4 cm from cervical os. See below for full report. Follow up in 2 wks.   Doreene BurkeAnnie Damontay Alred, CNM    ULTRASOUND REPORT  Location: [facility] Date of Service:  01/06/17  Indications: Growth for Marginal Previa Findings:  Singleton intrauterine pregnancy is visualized with FHR at 147 BPM. Biometrics give an (U/S) Gestational age of 833 weeks and an (U/S) EDD of 02/24/17; this correlates with the clinically established EDD of 03/01/17.  Fetal presentation is vertex.  EFW: 2225 grams (4lb 14oz).  61st percentile.  AC measures 94th percentile. Placenta: Anterior and grade 1.  Placenta is 7.4 cm from cervical os. AFI: 15.2 cm.   Impression: 1. 33 week Viable Singleton Intrauterine pregnancy by U/S. 2. (U/S) EDD is consistent with Clinically established (LMP) EDD of 03/01/17. 3. EFW: 2225 grams (4lb 14oz).  61st percentile.  AC measures 94th percentile. 4. Placenta is no longer a marginal previa at 7.4 cm from cervical os.  Recommendations: 1.Clinical correlation with the patient's History and Physical Exam.

## 2017-01-20 ENCOUNTER — Encounter: Payer: Managed Care, Other (non HMO) | Admitting: Certified Nurse Midwife

## 2017-01-23 ENCOUNTER — Ambulatory Visit (INDEPENDENT_AMBULATORY_CARE_PROVIDER_SITE_OTHER): Payer: Managed Care, Other (non HMO) | Admitting: Certified Nurse Midwife

## 2017-01-23 VITALS — BP 122/75 | HR 95 | Wt 264.3 lb

## 2017-01-23 DIAGNOSIS — M7989 Other specified soft tissue disorders: Secondary | ICD-10-CM

## 2017-01-23 DIAGNOSIS — Z3493 Encounter for supervision of normal pregnancy, unspecified, third trimester: Secondary | ICD-10-CM

## 2017-01-23 LAB — POCT URINALYSIS DIPSTICK
Bilirubin, UA: NEGATIVE
Blood, UA: NEGATIVE
Glucose, UA: NEGATIVE
KETONES UA: NEGATIVE
LEUKOCYTES UA: NEGATIVE
NITRITE UA: NEGATIVE
PH UA: 7 (ref 5.0–8.0)
PROTEIN UA: NEGATIVE
Spec Grav, UA: 1.015 (ref 1.010–1.025)
UROBILINOGEN UA: 0.2 U/dL

## 2017-01-23 NOTE — Patient Instructions (Signed)
Vaginal Delivery Vaginal delivery means that you will give birth by pushing your baby out of your birth canal (vagina). A team of health care providers will help you before, during, and after vaginal delivery. Birth experiences are unique for every woman and every pregnancy, and birth experiences vary depending on where you choose to give birth. What should I do to prepare for my baby's birth? Before your baby is born, it is important to talk with your health care provider about:  Your labor and delivery preferences. These may include: ? Medicines that you may be given. ? How you will manage your pain. This might include non-medical pain relief techniques or injectable pain relief such as epidural analgesia. ? How you and your baby will be monitored during labor and delivery. ? Who may be in the labor and delivery room with you. ? Your feelings about surgical delivery of your baby (cesarean delivery, or C-section) if this becomes necessary. ? Your feelings about receiving donated blood through an IV tube (blood transfusion) if this becomes necessary.  Whether you are able: ? To take pictures or videos of the birth. ? To eat during labor and delivery. ? To move around, walk, or change positions during labor and delivery.  What to expect after your baby is born, such as: ? Whether delayed umbilical cord clamping and cutting is offered. ? Who will care for your baby right after birth. ? Medicines or tests that may be recommended for your baby. ? Whether breastfeeding is supported in your hospital or birth center. ? How long you will be in the hospital or birth center.  How any medical conditions you have may affect your baby or your labor and delivery experience.  To prepare for your baby's birth, you should also:  Attend all of your health care visits before delivery (prenatal visits) as recommended by your health care provider. This is important.  Prepare your home for your baby's  arrival. Make sure that you have: ? Diapers. ? Baby clothing. ? Feeding equipment. ? Safe sleeping arrangements for you and your baby.  Install a car seat in your vehicle. Have your car seat checked by a certified car seat installer to make sure that it is installed safely.  Think about who will help you with your new baby at home for at least the first several weeks after delivery.  What can I expect when I arrive at the birth center or hospital? Once you are in labor and have been admitted into the hospital or birth center, your health care provider may:  Review your pregnancy history and any concerns you have.  Insert an IV tube into one of your veins. This is used to give you fluids and medicines.  Check your blood pressure, pulse, temperature, and heart rate (vital signs).  Check whether your bag of water (amniotic sac) has broken (ruptured).  Talk with you about your birth plan and discuss pain control options.  Monitoring Your health care provider may monitor your contractions (uterine monitoring) and your baby's heart rate (fetal monitoring). You may need to be monitored:  Often, but not continuously (intermittently).  All the time or for long periods at a time (continuously). Continuous monitoring may be needed if: ? You are taking certain medicines, such as medicine to relieve pain or make your contractions stronger. ? You have pregnancy or labor complications.  Monitoring may be done by:  Placing a special stethoscope or a handheld monitoring device on your abdomen to   check your baby's heartbeat, and feeling your abdomen for contractions. This method of monitoring does not continuously record your baby's heartbeat or your contractions.  Placing monitors on your abdomen (external monitors) to record your baby's heartbeat and the frequency and length of contractions. You may not have to wear external monitors all the time.  Placing monitors inside of your uterus  (internal monitors) to record your baby's heartbeat and the frequency, length, and strength of your contractions. ? Your health care provider may use internal monitors if he or she needs more information about the strength of your contractions or your baby's heart rate. ? Internal monitors are put in place by passing a thin, flexible wire through your vagina and into your uterus. Depending on the type of monitor, it may remain in your uterus or on your baby's head until birth. ? Your health care provider will discuss the benefits and risks of internal monitoring with you and will ask for your permission before inserting the monitors.  Telemetry. This is a type of continuous monitoring that can be done with external or internal monitors. Instead of having to stay in bed, you are able to move around during telemetry. Ask your health care provider if telemetry is an option for you.  Physical exam Your health care provider may perform a physical exam. This may include:  Checking whether your baby is positioned: ? With the head toward your vagina (head-down). This is most common. ? With the head toward the top of your uterus (head-up or breech). If your baby is in a breech position, your health care provider may try to turn your baby to a head-down position so you can deliver vaginally. If it does not seem that your baby can be born vaginally, your provider may recommend surgery to deliver your baby. In rare cases, you may be able to deliver vaginally if your baby is head-up (breech delivery). ? Lying sideways (transverse). Babies that are lying sideways cannot be delivered vaginally.  Checking your cervix to determine: ? Whether it is thinning out (effacing). ? Whether it is opening up (dilating). ? How low your baby has moved into your birth canal.  What are the three stages of labor and delivery?  Normal labor and delivery is divided into the following three stages: Stage 1  Stage 1 is the  longest stage of labor, and it can last for hours or days. Stage 1 includes: ? Early labor. This is when contractions may be irregular, or regular and mild. Generally, early labor contractions are more than 10 minutes apart. ? Active labor. This is when contractions get longer, more regular, more frequent, and more intense. ? The transition phase. This is when contractions happen very close together, are very intense, and may last longer than during any other part of labor.  Contractions generally feel mild, infrequent, and irregular at first. They get stronger, more frequent (about every 2-3 minutes), and more regular as you progress from early labor through active labor and transition.  Many women progress through stage 1 naturally, but you may need help to continue making progress. If this happens, your health care provider may talk with you about: ? Rupturing your amniotic sac if it has not ruptured yet. ? Giving you medicine to help make your contractions stronger and more frequent.  Stage 1 ends when your cervix is completely dilated to 4 inches (10 cm) and completely effaced. This happens at the end of the transition phase. Stage 2  Once   your cervix is completely effaced and dilated to 4 inches (10 cm), you may start to feel an urge to push. It is common for the body to naturally take a rest before feeling the urge to push, especially if you received an epidural or certain other pain medicines. This rest period may last for up to 1-2 hours, depending on your unique labor experience.  During stage 2, contractions are generally less painful, because pushing helps relieve contraction pain. Instead of contraction pain, you may feel stretching and burning pain, especially when the widest part of your baby's head passes through the vaginal opening (crowning).  Your health care provider will closely monitor your pushing progress and your baby's progress through the vagina during stage 2.  Your  health care provider may massage the area of skin between your vaginal opening and anus (perineum) or apply warm compresses to your perineum. This helps it stretch as the baby's head starts to crown, which can help prevent perineal tearing. ? In some cases, an incision may be made in your perineum (episiotomy) to allow the baby to pass through the vaginal opening. An episiotomy helps to make the opening of the vagina larger to allow more room for the baby to fit through.  It is very important to breathe and focus so your health care provider can control the delivery of your baby's head. Your health care provider may have you decrease the intensity of your pushing, to help prevent perineal tearing.  After delivery of your baby's head, the shoulders and the rest of the body generally deliver very quickly and without difficulty.  Once your baby is delivered, the umbilical cord may be cut right away, or this may be delayed for 1-2 minutes, depending on your baby's health. This may vary among health care providers, hospitals, and birth centers.  If you and your baby are healthy enough, your baby may be placed on your chest or abdomen to help maintain the baby's temperature and to help you bond with each other. Some mothers and babies start breastfeeding at this time. Your health care team will dry your baby and help keep your baby warm during this time.  Your baby may need immediate care if he or she: ? Showed signs of distress during labor. ? Has a medical condition. ? Was born too early (prematurely). ? Had a bowel movement before birth (meconium). ? Shows signs of difficulty transitioning from being inside the uterus to being outside of the uterus. If you are planning to breastfeed, your health care team will help you begin a feeding. Stage 3  The third stage of labor starts immediately after the birth of your baby and ends after you deliver the placenta. The placenta is an organ that develops  during pregnancy to provide oxygen and nutrients to your baby in the womb.  Delivering the placenta may require some pushing, and you may have mild contractions. Breastfeeding can stimulate contractions to help you deliver the placenta.  After the placenta is delivered, your uterus should tighten (contract) and become firm. This helps to stop bleeding in your uterus. To help your uterus contract and to control bleeding, your health care provider may: ? Give you medicine by injection, through an IV tube, by mouth, or through your rectum (rectally). ? Massage your abdomen or perform a vaginal exam to remove any blood clots that are left in your uterus. ? Empty your bladder by placing a thin, flexible tube (catheter) into your bladder. ? Encourage   you to breastfeed your baby. After labor is over, you and your baby will be monitored closely to ensure that you are both healthy until you are ready to go home. Your health care team will teach you how to care for yourself and your baby. This information is not intended to replace advice given to you by your health care provider. Make sure you discuss any questions you have with your health care provider. Document Released: 11/07/2007 Document Revised: 08/18/2015 Document Reviewed: 02/12/2015 Elsevier Interactive Patient Education  2018 [employer]  

## 2017-01-23 NOTE — Progress Notes (Signed)
ROB- pt is having some swelling in her R leg, denies any headaches

## 2017-01-24 NOTE — Progress Notes (Signed)
ROB-Report right lower extremity swelling and "bruise" to ankle. Negative homan sign. Discussed home treatment measures. Will order ultrasound for evaluation, see orders. Reviewed red flag symptoms and when to call. RTC x 2 weeks for 36 week cultures and ROB or sooner if needed.

## 2017-01-29 ENCOUNTER — Encounter: Payer: Self-pay | Admitting: Certified Nurse Midwife

## 2017-01-31 ENCOUNTER — Ambulatory Visit (HOSPITAL_COMMUNITY): Payer: Managed Care, Other (non HMO)

## 2017-01-31 ENCOUNTER — Ambulatory Visit
Admission: RE | Admit: 2017-01-31 | Discharge: 2017-01-31 | Disposition: A | Discharge status: Home / Self Care | Payer: Managed Care, Other (non HMO) | Source: Ambulatory Visit | Attending: Certified Nurse Midwife | Admitting: Certified Nurse Midwife

## 2017-01-31 DIAGNOSIS — M7989 Other specified soft tissue disorders: Secondary | ICD-10-CM | POA: Insufficient documentation

## 2017-01-31 DIAGNOSIS — O26893 Other specified pregnancy related conditions, third trimester: Secondary | ICD-10-CM | POA: Insufficient documentation

## 2017-01-31 DIAGNOSIS — Z3493 Encounter for supervision of normal pregnancy, unspecified, third trimester: Secondary | ICD-10-CM

## 2017-02-06 ENCOUNTER — Encounter: Payer: Self-pay | Admitting: Certified Nurse Midwife

## 2017-02-06 ENCOUNTER — Ambulatory Visit (INDEPENDENT_AMBULATORY_CARE_PROVIDER_SITE_OTHER): Payer: Managed Care, Other (non HMO) | Admitting: Certified Nurse Midwife

## 2017-02-06 VITALS — BP 125/67 | HR 96 | Wt 271.4 lb

## 2017-02-06 DIAGNOSIS — Z113 Encounter for screening for infections with a predominantly sexual mode of transmission: Secondary | ICD-10-CM

## 2017-02-06 DIAGNOSIS — Z3493 Encounter for supervision of normal pregnancy, unspecified, third trimester: Secondary | ICD-10-CM

## 2017-02-06 LAB — POCT URINALYSIS DIPSTICK
BILIRUBIN UA: NEGATIVE
GLUCOSE UA: NEGATIVE
Ketones, UA: NEGATIVE
LEUKOCYTES UA: NEGATIVE
Nitrite, UA: NEGATIVE
Protein, UA: NEGATIVE
SPEC GRAV UA: 1.025 (ref 1.010–1.025)
Urobilinogen, UA: 0.2 E.U./dL
pH, UA: 6.5 (ref 5.0–8.0)

## 2017-02-06 NOTE — Patient Instructions (Signed)
Abdominal Pain During Pregnancy °Belly (abdominal) pain is common during pregnancy. Most of the time, it is not a serious problem. Other times, it can be a sign that something is wrong with the pregnancy. Always tell your doctor if you have belly pain. °Follow these instructions at home: °Monitor your belly pain for any changes. The following actions may help you feel better: °· Do not have sex (intercourse) or put anything in your vagina until you feel better. °· Rest until your pain stops. °· Drink clear fluids if you feel sick to your stomach (nauseous). Do not eat solid food until you feel better. °· Only take medicine as told by your doctor. °· Keep all doctor visits as told. ° °Get help right away if: °· You are bleeding, leaking fluid, or pieces of tissue come out of your vagina. °· You have more pain or cramping. °· You keep throwing up (vomiting). °· You have pain when you pee (urinate) or have blood in your pee. °· You have a fever. °· You do not feel your baby moving as much. °· You feel very weak or feel like passing out. °· You have trouble breathing, with or without belly pain. °· You have a very bad headache and belly pain. °· You have fluid leaking from your vagina and belly pain. °· You keep having watery poop (diarrhea). °· Your belly pain does not go away after resting, or the pain gets worse. °This information is not intended to replace advice given to you by your health care provider. Make sure you discuss any questions you have with your health care provider. °Document Released: 01/16/2009 Document Revised: 09/06/2015 Document Reviewed: 08/27/2012 °Elsevier Interactive Patient Education © 2018 [employer] °Back Pain in Pregnancy °Back pain during pregnancy is common. Back pain may be caused by several factors that are related to changes during your pregnancy. °Follow these instructions at home: °Managing pain, stiffness, and swelling °· If directed, apply ice for sudden (acute) back  pain. °? Put ice in a plastic bag. °? Place a towel between your skin and the bag. °? Leave the ice on for 20 minutes, 2-3 times per day. °· If directed, apply heat to the affected area before you exercise: °? Place a towel between your skin and the heat pack or heating pad. °? Leave the heat on for 20-30 minutes. °? Remove the heat if your skin turns bright red. This is especially important if you are unable to feel pain, heat, or cold. You may have a greater risk of getting burned. °Activity °· Exercise as told by your health care provider. Exercising is the best way to prevent or manage back pain. °· Listen to your body when lifting. If lifting hurts, ask for help or bend your knees. This uses your leg muscles instead of your back muscles. °· Squat down when picking up something from the floor. Do not bend over. °· Only use bed rest as told by your health care provider. Bed rest should only be used for the most severe episodes of back pain. °Standing, Sitting, and Lying Down °· Do not stand in one place for long periods of time. °· Use good posture when sitting. Make sure your head rests over your shoulders and is not hanging forward. Use a pillow on your lower back if necessary. °· Try sleeping on your side, preferably the left side, with a pillow or two between your legs. If you are sore after a night's rest, your bed may   be too soft. A firm mattress may provide more support for your back during pregnancy. °General instructions °· Do not wear high heels. °· Eat a healthy diet. Try to gain weight within your health care provider's recommendations. °· Use a maternity girdle, elastic sling, or back brace as told by your health care provider. °· Take over-the-counter and prescription medicines only as told by your health care provider. °· Keep all follow-up visits as told by your health care provider. This is important. This includes any visits with any specialists, such as a physical therapist. °Contact a health  care provider if: °· Your back pain interferes with your daily activities. °· You have increasing pain in other parts of your body. °Get help right away if: °· You develop numbness, tingling, weakness, or problems with the use of your arms or legs. °· You develop severe back pain that is not controlled with medicine. °· You have a sudden change in bowel or bladder control. °· You develop shortness of breath, dizziness, or you faint. °· You develop nausea, vomiting, or sweating. °· You have back pain that is a rhythmic, cramping pain similar to labor pains. Labor pain is usually 1-2 minutes apart, lasts for about 1 minute, and involves a bearing down feeling or pressure in your pelvis. °· You have back pain and your water breaks or you have vaginal bleeding. °· You have back pain or numbness that travels down your leg. °· Your back pain developed after you fell. °· You develop pain on one side of your back. °· You see blood in your urine. °· You develop skin blisters in the area of your back pain. °This information is not intended to replace advice given to you by your health care provider. Make sure you discuss any questions you have with your health care provider. °Document Released: 05/08/2005 Document Revised: 07/06/2015 Document Reviewed: 10/12/2014 °Elsevier Interactive Patient Education © 2018 [employer] ° °

## 2017-02-06 NOTE — Progress Notes (Signed)
ROB- Pt here today c/o lots of swelling in both her feet,lots of pressure and discomfort

## 2017-02-08 LAB — STREP GP B NAA: Strep Gp B NAA: POSITIVE — AB

## 2017-02-08 LAB — GC/CHLAMYDIA PROBE AMP
Chlamydia trachomatis, NAA: NEGATIVE
NEISSERIA GONORRHOEAE BY PCR: NEGATIVE

## 2017-02-10 ENCOUNTER — Encounter: Payer: Self-pay | Admitting: Certified Nurse Midwife

## 2017-02-10 DIAGNOSIS — B951 Streptococcus, group B, as the cause of diseases classified elsewhere: Secondary | ICD-10-CM | POA: Insufficient documentation

## 2017-02-10 NOTE — Progress Notes (Signed)
ROB-Reports bilateral lower extremity swelling and increased discomfort when working. No relief with home treatment measures. US 12/21 negative for bilateral DVT. Encouraged hydration, compression stockings and foot elevation. Work note given, see communications tab. Baby's name will be "Zenda AlpersSawyer". 36 week cultures collected. Reviewed red flag symptoms and when to call. Reviewed red flag symptoms and when to call. RTC x 1 week for ROB with Pattricia BossAnnie or sooner if needed.

## 2017-02-11 NOTE — L&D Delivery Note (Signed)
     Delivery Note   Alicia CreaseSarah D Chimenti is a 27 y.o. G3P1011 at 635w0d Estimated Date of Delivery: 03/01/17  PRE-OPERATIVE DIAGNOSIS:  1) 255w0d pregnancy.   POST-OPERATIVE DIAGNOSIS:  1) 485w0d pregnancy s/p Vaginal, Spontaneous   Delivery Type: Vaginal, Spontaneous    Delivery Anesthesia: Epidural   Labor Complications:   none    ESTIMATED BLOOD LOSS: 250 ml    FINDINGS:   1) female infant, Apgar scores of   8  at 1 minute and    9 at 5 minutes and a birthweight of    ounces.    2) Nuchal cord: yes, true knot  SPECIMENS:   PLACENTA:   Appearance: Intact , cord blood sample collected   Removal: Spontaneous      Disposition:    held per protocol , then discarded  DISPOSITION:  Infant to left in stable condition in the delivery room, with L&D personnel and mother,  NARRATIVE SUMMARY: Labor course:  Ms. Alicia Howard is a [id number] at 735w0d who presented for induction of labor.  She progressed well in labor with pitocin.  She received the epidural anesthesia and proceeded to complete dilation. Delivered immediately after placement. She evidenced good maternal expulsive effort during the second stage. She went on to deliver a viable female infant "Alicia Howard". The placenta delivered without problems and was noted to be complete. A perineal and vaginal examination was performed. Episiotomy/Lacerations:   intact. The patient tolerated this well.  Doreene Burkennie Jet Traynham, CNM  03/01/2017 2:28 PM

## 2017-02-12 ENCOUNTER — Encounter: Payer: Self-pay | Admitting: Certified Nurse Midwife

## 2017-02-14 ENCOUNTER — Encounter: Payer: Self-pay | Admitting: Certified Nurse Midwife

## 2017-02-14 ENCOUNTER — Ambulatory Visit (INDEPENDENT_AMBULATORY_CARE_PROVIDER_SITE_OTHER): Payer: Managed Care, Other (non HMO) | Admitting: Certified Nurse Midwife

## 2017-02-14 VITALS — BP 120/82 | HR 89 | Wt 269.8 lb

## 2017-02-14 DIAGNOSIS — Z3493 Encounter for supervision of normal pregnancy, unspecified, third trimester: Secondary | ICD-10-CM

## 2017-02-14 LAB — POCT URINALYSIS DIPSTICK
Bilirubin, UA: NEGATIVE
Glucose, UA: NEGATIVE
Ketones, UA: NEGATIVE
LEUKOCYTES UA: NEGATIVE
NITRITE UA: NEGATIVE
PROTEIN UA: NEGATIVE
SPEC GRAV UA: 1.015 (ref 1.010–1.025)
Urobilinogen, UA: 0.2 E.U./dL
pH, UA: 7 (ref 5.0–8.0)

## 2017-02-14 NOTE — Patient Instructions (Signed)
Braxton Hicks Contractions °Contractions of the uterus can occur throughout pregnancy, but they are not always a sign that you are in labor. You may have practice contractions called Braxton Hicks contractions. These false labor contractions are sometimes confused with true labor. °What are Braxton Hicks contractions? °Braxton Hicks contractions are tightening movements that occur in the muscles of the uterus before labor. Unlike true labor contractions, these contractions do not result in opening (dilation) and thinning of the cervix. Toward the end of pregnancy (32-34 weeks), Braxton Hicks contractions can happen more often and may become stronger. These contractions are sometimes difficult to tell apart from true labor because they can be very uncomfortable. You should not feel embarrassed if you go to the hospital with false labor. °Sometimes, the only way to tell if you are in true labor is for your health care provider to look for changes in the cervix. The health care provider will do a physical exam and may monitor your contractions. If you are not in true labor, the exam should show that your cervix is not dilating and your water has not broken. °If there are other health problems associated with your pregnancy, it is completely safe for you to be sent home with false labor. You may continue to have Braxton Hicks contractions until you go into true labor. °How to tell the difference between true labor and false labor °True labor °· Contractions last 30-70 seconds. °· Contractions become very regular. °· Discomfort is usually felt in the top of the uterus, and it spreads to the lower abdomen and low back. °· Contractions do not go away with walking. °· Contractions usually become more intense and increase in frequency. °· The cervix dilates and gets thinner. °False labor °· Contractions are usually shorter and not as strong as true labor contractions. °· Contractions are usually irregular. °· Contractions  are often felt in the front of the lower abdomen and in the groin. °· Contractions may go away when you walk around or change positions while lying down. °· Contractions get weaker and are shorter-lasting as time goes on. °· The cervix usually does not dilate or become thin. °Follow these instructions at home: °· Take over-the-counter and prescription medicines only as told by your health care provider. °· Keep up with your usual exercises and follow other instructions from your health care provider. °· Eat and drink lightly if you think you are going into labor. °· If Braxton Hicks contractions are making you uncomfortable: °? Change your position from lying down or resting to walking, or change from walking to resting. °? Sit and rest in a tub of warm water. °? Drink enough fluid to keep your urine pale yellow. Dehydration may cause these contractions. °? Do slow and deep breathing several times an hour. °· Keep all follow-up prenatal visits as told by your health care provider. This is important. °Contact a health care provider if: °· You have a fever. °· You have continuous pain in your abdomen. °Get help right away if: °· Your contractions become stronger, more regular, and closer together. °· You have fluid leaking or gushing from your vagina. °· You pass blood-tinged mucus (bloody show). °· You have bleeding from your vagina. °· You have low back pain that you never had before. °· You feel your baby’s head pushing down and causing pelvic pressure. °· Your baby is not moving inside you as much as it used to. °Summary °· Contractions that occur before labor are called Braxton   Hicks contractions, false labor, or practice contractions. °· Braxton Hicks contractions are usually shorter, weaker, farther apart, and less regular than true labor contractions. True labor contractions usually become progressively stronger and regular and they become more frequent. °· Manage discomfort from Braxton Hicks contractions by  changing position, resting in a warm bath, drinking plenty of water, or practicing deep breathing. °This information is not intended to replace advice given to you by your health care provider. Make sure you discuss any questions you have with your health care provider. °Document Released: 06/13/2016 Document Revised: 06/13/2016 Document Reviewed: 06/13/2016 °Elsevier Interactive Patient Education © 2018 [employer] ° °

## 2017-02-14 NOTE — Progress Notes (Signed)
ROB, doing well. Feels fetal movement and occasional contractions. Body mass index is 46.31 kg/m. Order placed for anesthesia consult due to BMI.  Reviewed labor precautions . Follow up 1 wk.   Doreene BurkeAnnie Bakari Nikolai, CNM

## 2017-02-14 NOTE — Progress Notes (Signed)
ROB- Pt has no complaints  

## 2017-02-15 IMAGING — US US EXTREM LOW VENOUS*R*
1 series · 13 of 24 positions shown · non-contrast
Comparison: None.

CLINICAL DATA: 26-year-old female with a history of right leg
swelling in third trimester pregnancy



[Series 1: us extrem low venous*right* · 0.07mm/px · 13 of 31 slices shown]
[im 1/31]
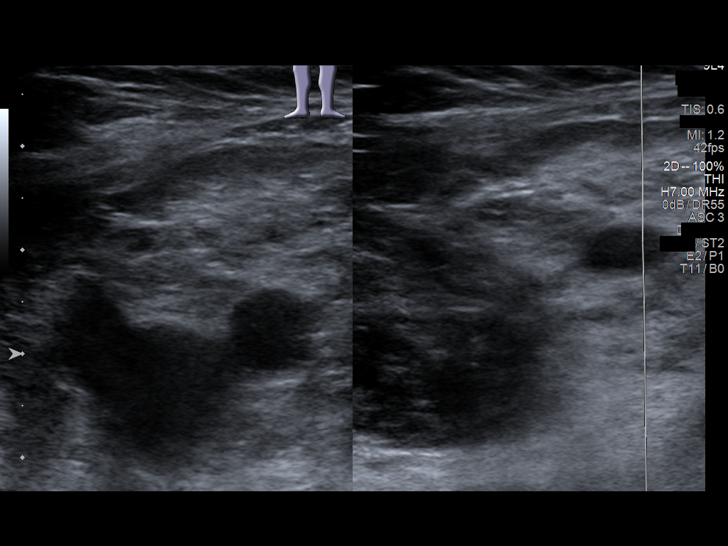
[im 3/31]
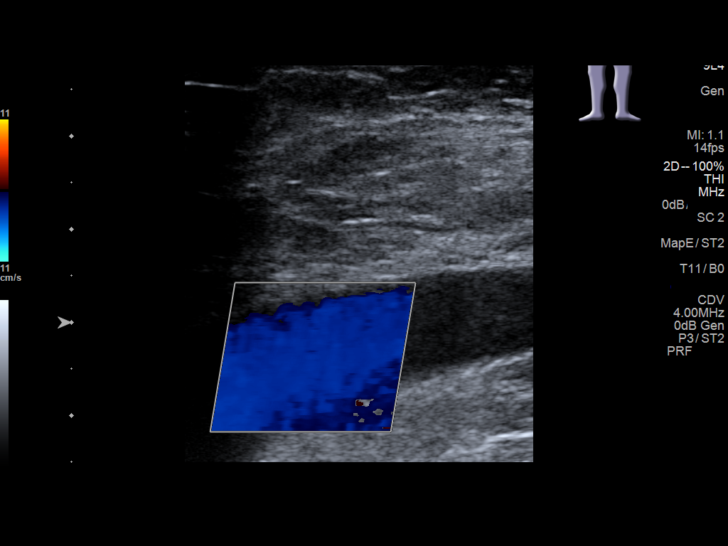
[im 6/31]
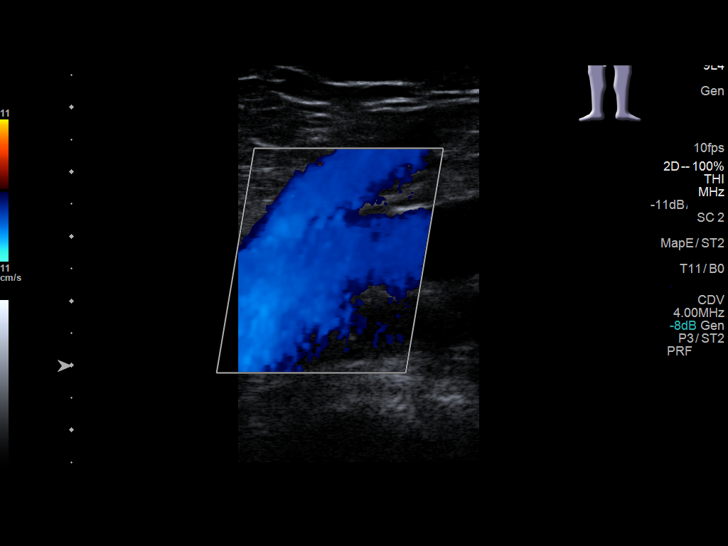
[im 8/31]
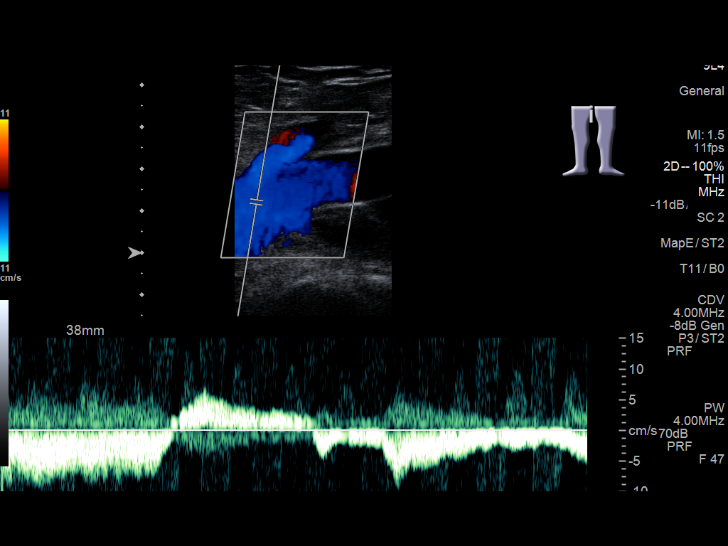
[im 11/31]
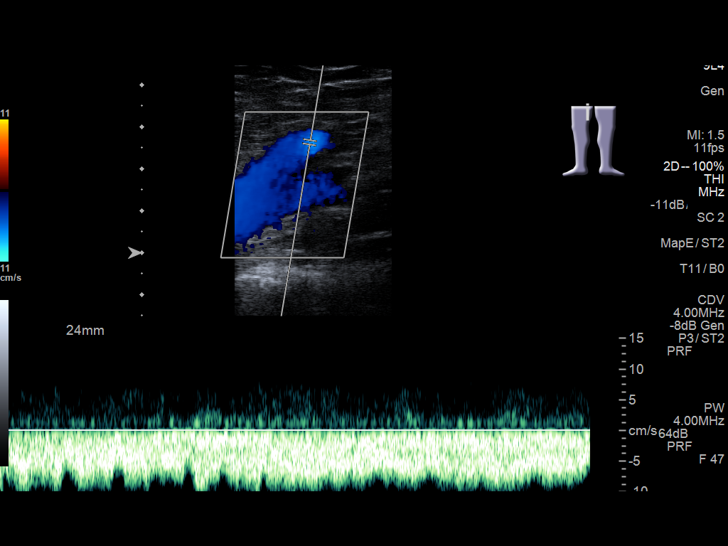
[im 14/31]
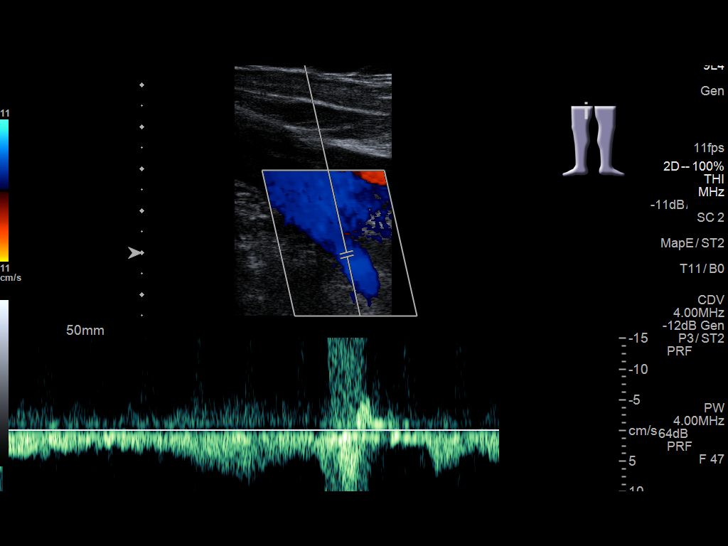
[im 16/31]
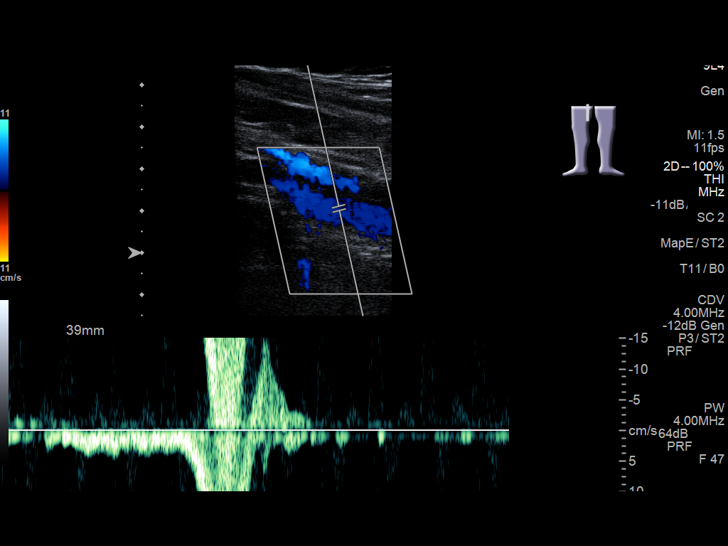
[im 17/31]
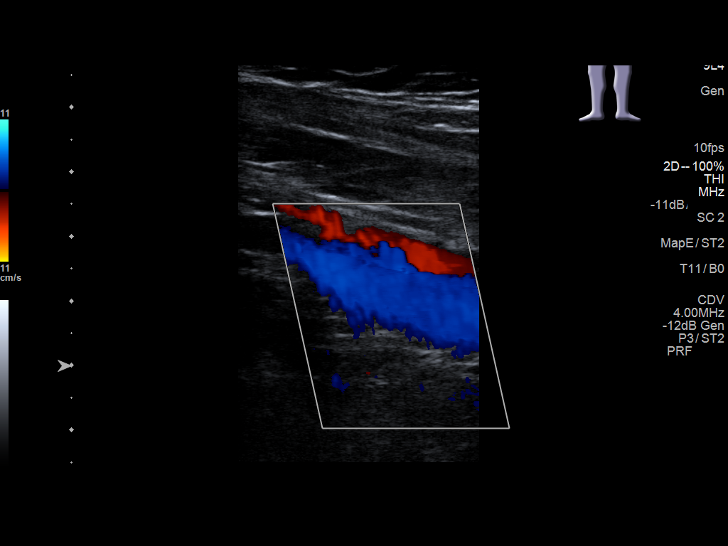
[im 20/31]
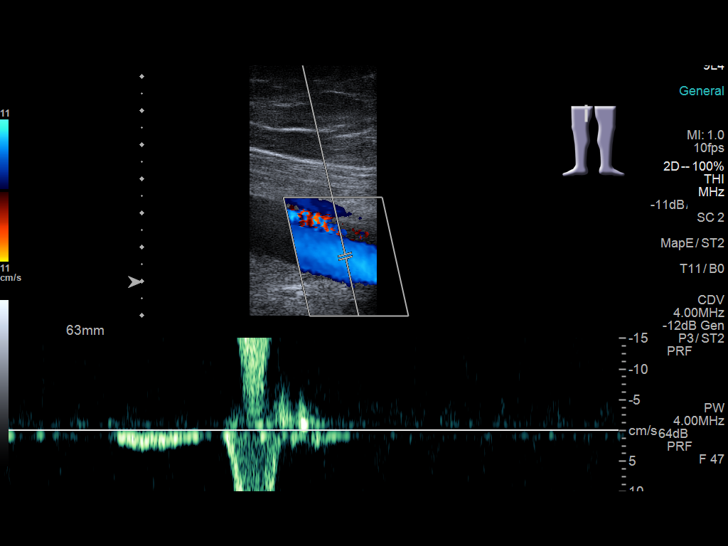
[im 23/31]
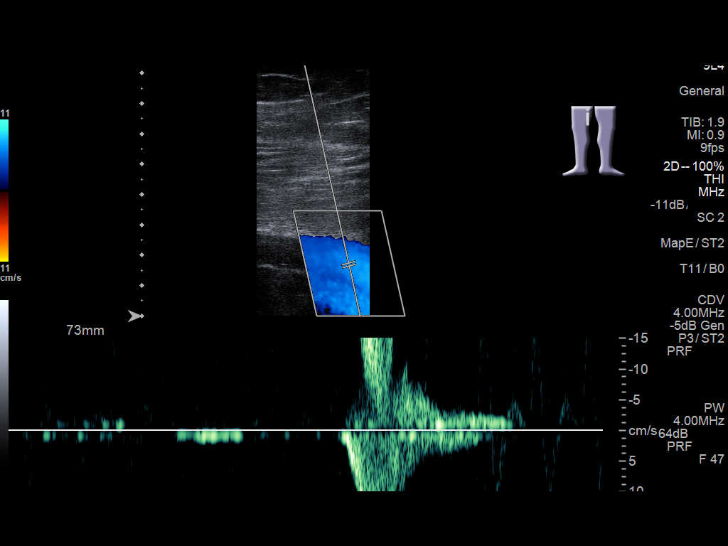
[im 25/31]
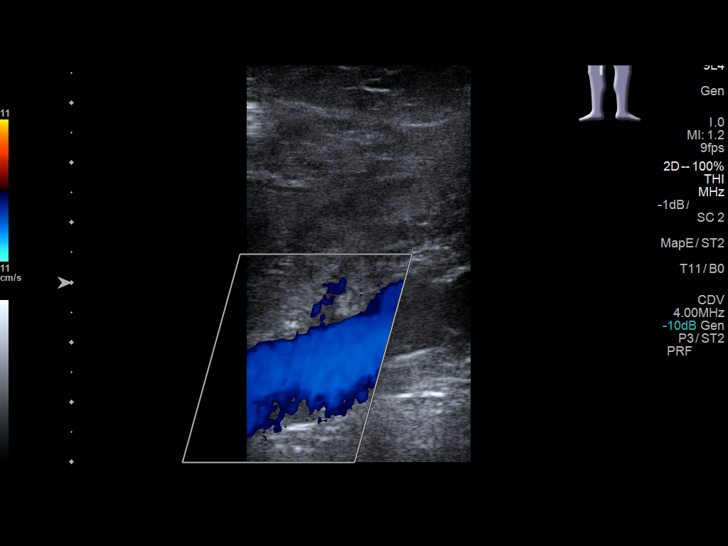
[im 28/31]
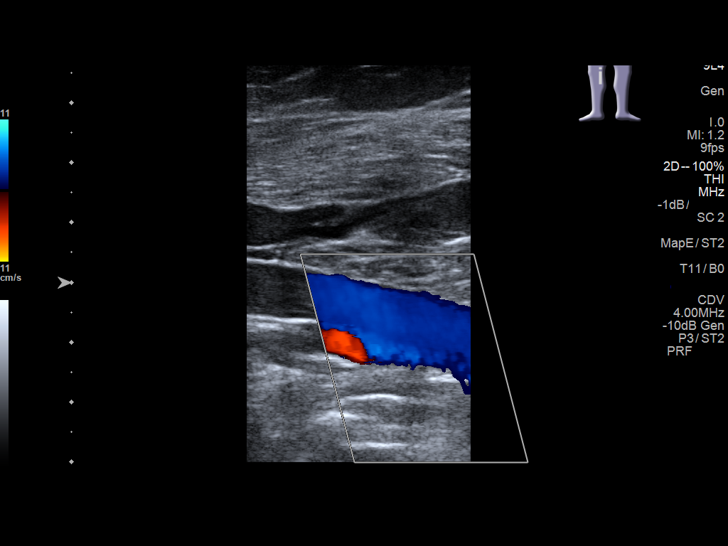
[im 31/31]
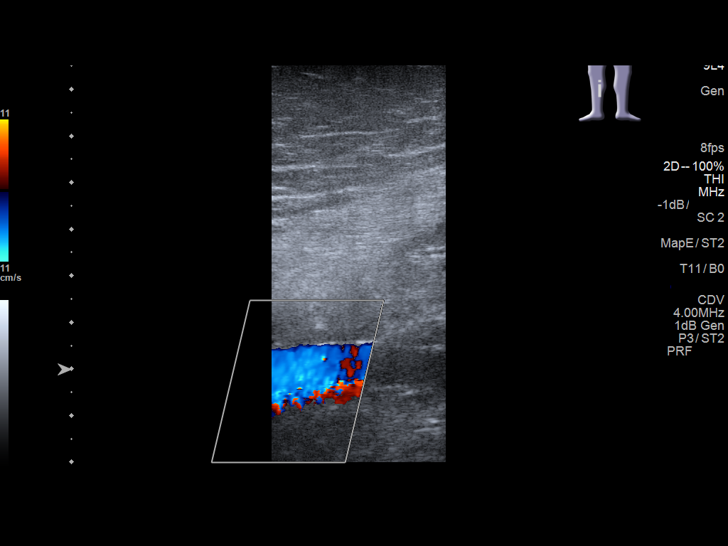

[13 of 24 positions shown; findings below may reference images not displayed]

FINDINGS: Contralateral Common Femoral Vein: Respiratory phasicity is normal
and symmetric with the symptomatic side. No evidence of thrombus.
Normal compressibility.

Common Femoral Vein: No evidence of thrombus. Normal
compressibility, respiratory phasicity and response to augmentation.

Saphenofemoral Junction: No evidence of thrombus. Normal
compressibility and flow on color Doppler imaging.

Profunda Femoral Vein: No evidence of thrombus. Normal
compressibility and flow on color Doppler imaging.

Femoral Vein: No evidence of thrombus. Normal compressibility,
respiratory phasicity and response to augmentation.

Popliteal Vein: No evidence of thrombus. Normal compressibility,
respiratory phasicity and response to augmentation.

Calf Veins: No evidence of thrombus. Normal compressibility and flow
on color Doppler imaging.

Superficial Great Saphenous Vein: No evidence of thrombus. Normal
compressibility and flow on color Doppler imaging.

Other Findings:  None.
IMPRESSION: Sonographic survey of the right lower extremity negative for DVT

## 2017-02-20 ENCOUNTER — Inpatient Hospital Stay: Admission: RE | Admit: 2017-02-20 | Payer: Managed Care, Other (non HMO) | Source: Ambulatory Visit

## 2017-02-21 ENCOUNTER — Encounter: Payer: Self-pay | Admitting: Certified Nurse Midwife

## 2017-02-21 ENCOUNTER — Ambulatory Visit (INDEPENDENT_AMBULATORY_CARE_PROVIDER_SITE_OTHER): Payer: Managed Care, Other (non HMO) | Admitting: Certified Nurse Midwife

## 2017-02-21 VITALS — BP 119/81 | HR 86 | Wt 268.7 lb

## 2017-02-21 DIAGNOSIS — Z3493 Encounter for supervision of normal pregnancy, unspecified, third trimester: Secondary | ICD-10-CM

## 2017-02-21 LAB — POCT URINALYSIS DIPSTICK
BILIRUBIN UA: NEGATIVE
GLUCOSE UA: NEGATIVE
KETONES UA: NEGATIVE
Leukocytes, UA: NEGATIVE
Nitrite, UA: NEGATIVE
ODOR: NEGATIVE
Spec Grav, UA: 1.02 (ref 1.010–1.025)
Urobilinogen, UA: 0.2 E.U./dL
pH, UA: 6.5 (ref 5.0–8.0)

## 2017-02-21 NOTE — Progress Notes (Signed)
ROB, doing well. Reviewed labor precautions. Discussed NST at next visit and scheduling of induction for 41wks. PT verbalizes understanding and agrees to plan.   Doreene BurkeAnnie Shanicka Oldenkamp, CNM

## 2017-02-21 NOTE — Patient Instructions (Signed)
Braxton Hicks Contractions °Contractions of the uterus can occur throughout pregnancy, but they are not always a sign that you are in labor. You may have practice contractions called Braxton Hicks contractions. These false labor contractions are sometimes confused with true labor. °What are Braxton Hicks contractions? °Braxton Hicks contractions are tightening movements that occur in the muscles of the uterus before labor. Unlike true labor contractions, these contractions do not result in opening (dilation) and thinning of the cervix. Toward the end of pregnancy (32-34 weeks), Braxton Hicks contractions can happen more often and may become stronger. These contractions are sometimes difficult to tell apart from true labor because they can be very uncomfortable. You should not feel embarrassed if you go to the hospital with false labor. °Sometimes, the only way to tell if you are in true labor is for your health care provider to look for changes in the cervix. The health care provider will do a physical exam and may monitor your contractions. If you are not in true labor, the exam should show that your cervix is not dilating and your water has not broken. °If there are other health problems associated with your pregnancy, it is completely safe for you to be sent home with false labor. You may continue to have Braxton Hicks contractions until you go into true labor. °How to tell the difference between true labor and false labor °True labor °· Contractions last 30-70 seconds. °· Contractions become very regular. °· Discomfort is usually felt in the top of the uterus, and it spreads to the lower abdomen and low back. °· Contractions do not go away with walking. °· Contractions usually become more intense and increase in frequency. °· The cervix dilates and gets thinner. °False labor °· Contractions are usually shorter and not as strong as true labor contractions. °· Contractions are usually irregular. °· Contractions  are often felt in the front of the lower abdomen and in the groin. °· Contractions may go away when you walk around or change positions while lying down. °· Contractions get weaker and are shorter-lasting as time goes on. °· The cervix usually does not dilate or become thin. °Follow these instructions at home: °· Take over-the-counter and prescription medicines only as told by your health care provider. °· Keep up with your usual exercises and follow other instructions from your health care provider. °· Eat and drink lightly if you think you are going into labor. °· If Braxton Hicks contractions are making you uncomfortable: °? Change your position from lying down or resting to walking, or change from walking to resting. °? Sit and rest in a tub of warm water. °? Drink enough fluid to keep your urine pale yellow. Dehydration may cause these contractions. °? Do slow and deep breathing several times an hour. °· Keep all follow-up prenatal visits as told by your health care provider. This is important. °Contact a health care provider if: °· You have a fever. °· You have continuous pain in your abdomen. °Get help right away if: °· Your contractions become stronger, more regular, and closer together. °· You have fluid leaking or gushing from your vagina. °· You pass blood-tinged mucus (bloody show). °· You have bleeding from your vagina. °· You have low back pain that you never had before. °· You feel your baby’s head pushing down and causing pelvic pressure. °· Your baby is not moving inside you as much as it used to. °Summary °· Contractions that occur before labor are called Braxton   Hicks contractions, false labor, or practice contractions. °· Braxton Hicks contractions are usually shorter, weaker, farther apart, and less regular than true labor contractions. True labor contractions usually become progressively stronger and regular and they become more frequent. °· Manage discomfort from Braxton Hicks contractions by  changing position, resting in a warm bath, drinking plenty of water, or practicing deep breathing. °This information is not intended to replace advice given to you by your health care provider. Make sure you discuss any questions you have with your health care provider. °Document Released: 06/13/2016 Document Revised: 06/13/2016 Document Reviewed: 06/13/2016 °Elsevier Interactive Patient Education © 2018 [employer] ° °

## 2017-02-21 NOTE — Progress Notes (Signed)
ROB- no complaints.  

## 2017-02-24 ENCOUNTER — Encounter
Admission: RE | Admit: 2017-02-24 | Discharge: 2017-02-24 | Disposition: A | Discharge status: Home / Self Care | Payer: Managed Care, Other (non HMO) | Source: Ambulatory Visit | Attending: Anesthesiology | Admitting: Anesthesiology

## 2017-02-24 NOTE — Anesthesia Pain Management Evaluation Note (Signed)
SEEN BY ANESTHESIA AND OK TO DELIVER AT [facility]. [facility] OFFICE NOTIFIED

## 2017-02-27 ENCOUNTER — Ambulatory Visit (INDEPENDENT_AMBULATORY_CARE_PROVIDER_SITE_OTHER): Payer: Managed Care, Other (non HMO) | Admitting: Certified Nurse Midwife

## 2017-02-27 ENCOUNTER — Other Ambulatory Visit: Payer: Managed Care, Other (non HMO)

## 2017-02-27 VITALS — BP 134/80 | HR 97 | Wt 268.4 lb

## 2017-02-27 DIAGNOSIS — Z3493 Encounter for supervision of normal pregnancy, unspecified, third trimester: Secondary | ICD-10-CM | POA: Diagnosis not present

## 2017-02-27 LAB — POCT URINALYSIS DIPSTICK
Bilirubin, UA: NEGATIVE
Blood, UA: NEGATIVE
GLUCOSE UA: NEGATIVE
Ketones, UA: NEGATIVE
Nitrite, UA: NEGATIVE
Spec Grav, UA: 1.015 (ref 1.010–1.025)
Urobilinogen, UA: 0.2 E.U./dL
pH, UA: 7 (ref 5.0–8.0)

## 2017-02-27 NOTE — Patient Instructions (Signed)
Labor Induction Labor induction is when steps are taken to cause a pregnant woman to begin the labor process. Most women go into labor on their own between 37 weeks and 42 weeks of the pregnancy. When this does not happen or when there is a medical need, methods may be used to induce labor. Labor induction causes a pregnant woman's uterus to contract. It also causes the cervix to soften (ripen), open (dilate), and thin out (efface). Usually, labor is not induced before 39 weeks of the pregnancy unless there is a problem with the baby or mother. Before inducing labor, your health care provider will consider a number of factors, including the following:  The medical condition of you and the baby.  How many weeks along you are.  The status of the baby's lung maturity.  The condition of the cervix.  The position of the baby. What are the reasons for labor induction? Labor may be induced for the following reasons:  The health of the baby or mother is at risk.  The pregnancy is overdue by 1 week or more.  The water breaks but labor does not start on its own.  The mother has a health condition or serious illness, such as high blood pressure, infection, placental abruption, or diabetes.  The amniotic fluid amounts are low around the baby.  The baby is distressed. Convenience or wanting the baby to be born on a certain date is not a reason for inducing labor. What methods are used for labor induction? Several methods of labor induction may be used, such as:  Prostaglandin medicine. This medicine causes the cervix to dilate and ripen. The medicine will also start contractions. It can be taken by mouth or by inserting a suppository into the vagina.  Inserting a thin tube (catheter) with a balloon on the end into the vagina to dilate the cervix. Once inserted, the balloon is expanded with water, which causes the cervix to open.  Stripping the membranes. Your health care provider separates  amniotic sac tissue from the cervix, causing the cervix to be stretched and causing the release of a hormone called progesterone. This may cause the uterus to contract. It is often done during an office visit. You will be sent home to wait for the contractions to begin. You will then come in for an induction.  Breaking the water. Your health care provider makes a hole in the amniotic sac using a small instrument. Once the amniotic sac breaks, contractions should begin. This may still take hours to see an effect.  Medicine to trigger or strengthen contractions. This medicine is given through an IV access tube inserted into a vein in your arm. All of the methods of induction, besides stripping the membranes, will be done in the hospital. Induction is done in the hospital so that you and the baby can be carefully monitored. How long does it take for labor to be induced? Some inductions can take up to 2-3 days. Depending on the cervix, it usually takes less time. It takes longer when you are induced early in the pregnancy or if this is your first pregnancy. If a mother is still pregnant and the induction has been going on for 2-3 days, either the mother will be sent home or a cesarean delivery will be needed. What are the risks associated with labor induction? Some of the risks of induction include:  Changes in fetal heart rate, such as too high, too low, or erratic.  Fetal distress.    Chance of infection for the mother and baby.  Increased chance of having a cesarean delivery.  Breaking off (abruption) of the placenta from the uterus (rare).  Uterine rupture (very rare). When induction is needed for medical reasons, the benefits of induction may outweigh the risks. What are some reasons for not inducing labor? Labor induction should not be done if:  It is shown that your baby does not tolerate labor.  You have had previous surgeries on your uterus, such as a myomectomy or the removal of  fibroids.  Your placenta lies very low in the uterus and blocks the opening of the cervix (placenta previa).  Your baby is not in a head-down position.  The umbilical cord drops down into the birth canal in front of the baby. This could cut off the baby's blood and oxygen supply.  You have had a previous cesarean delivery.  There are unusual circumstances, such as the baby being extremely premature. This information is not intended to replace advice given to you by your health care provider. Make sure you discuss any questions you have with your health care provider. Document Released: 06/19/2006 Document Revised: 07/06/2015 Document Reviewed: 08/27/2012 Elsevier Interactive Patient Education  2017 [employer]vier [employer]  

## 2017-02-27 NOTE — Progress Notes (Signed)
ROB NST 

## 2017-02-27 NOTE — Progress Notes (Signed)
ROB-NST performed today was reviewed and was found to be reactive. Baseline 135 bpm with Moderate variability, accelerations present, and no decelerations noted. Induction of labor scheduled on Saturday, 03/01/2017 @ 0500 per Shift Coordinator Tresa EndoKelly, RN. IOL form given to Dr. Logan BoresEvans as information. Reviewed red flag symptoms and when to call. RTC x 6 weeks for PPV with Pattricia BossAnnie or sooner if needed.

## 2017-03-01 ENCOUNTER — Inpatient Hospital Stay: Payer: Managed Care, Other (non HMO) | Admitting: Anesthesiology

## 2017-03-01 ENCOUNTER — Other Ambulatory Visit: Payer: Self-pay

## 2017-03-01 ENCOUNTER — Inpatient Hospital Stay
Admission: AD | Admit: 2017-03-01 | Discharge: 2017-03-03 | DRG: 807 | Disposition: A | Discharge status: Home / Self Care | Payer: Managed Care, Other (non HMO) | Source: Ambulatory Visit | Attending: Certified Nurse Midwife | Admitting: Certified Nurse Midwife

## 2017-03-01 DIAGNOSIS — O99824 Streptococcus B carrier state complicating childbirth: Secondary | ICD-10-CM | POA: Diagnosis present

## 2017-03-01 DIAGNOSIS — Z91040 Latex allergy status: Secondary | ICD-10-CM | POA: Diagnosis not present

## 2017-03-01 DIAGNOSIS — O99214 Obesity complicating childbirth: Secondary | ICD-10-CM | POA: Diagnosis present

## 2017-03-01 DIAGNOSIS — B951 Streptococcus, group B, as the cause of diseases classified elsewhere: Secondary | ICD-10-CM

## 2017-03-01 DIAGNOSIS — Z3483 Encounter for supervision of other normal pregnancy, third trimester: Secondary | ICD-10-CM | POA: Diagnosis present

## 2017-03-01 DIAGNOSIS — Z3A40 40 weeks gestation of pregnancy: Secondary | ICD-10-CM

## 2017-03-01 DIAGNOSIS — Z3490 Encounter for supervision of normal pregnancy, unspecified, unspecified trimester: Secondary | ICD-10-CM | POA: Diagnosis present

## 2017-03-01 LAB — CBC
HCT: 35.4 % (ref 35.0–47.0)
HEMOGLOBIN: 12 g/dL (ref 12.0–16.0)
MCH: 30.1 pg (ref 26.0–34.0)
MCHC: 33.8 g/dL (ref 32.0–36.0)
MCV: 89.2 fL (ref 80.0–100.0)
Platelets: 213 10*3/uL (ref 150–440)
RBC: 3.97 MIL/uL (ref 3.80–5.20)
RDW: 15.5 % — AB (ref 11.5–14.5)
WBC: 9.9 10*3/uL (ref 3.6–11.0)

## 2017-03-01 LAB — TYPE AND SCREEN
ABO/RH(D): O POS
ANTIBODY SCREEN: NEGATIVE

## 2017-03-01 MED ORDER — COCONUT OIL OIL
1.0000 "application " | TOPICAL_OIL | Status: DC | PRN
  Administered 2017-03-02: 1 via TOPICAL
  Filled 2017-03-01: qty 120

## 2017-03-01 MED ORDER — DOCUSATE SODIUM 100 MG PO CAPS
100.0000 mg | ORAL_CAPSULE | Freq: Two times a day (BID) | ORAL | Status: DC
  Administered 2017-03-01 – 2017-03-03 (×4): 100 mg via ORAL
  Filled 2017-03-01 (×4): qty 1

## 2017-03-01 MED ORDER — TERBUTALINE SULFATE 1 MG/ML IJ SOLN: 0.2500 mg | Freq: Once | INTRAMUSCULAR | Status: DC | PRN

## 2017-03-01 MED ORDER — IBUPROFEN 600 MG PO TABS
600.0000 mg | ORAL_TABLET | Freq: Four times a day (QID) | ORAL | Status: DC
  Administered 2017-03-02 – 2017-03-03 (×6): 600 mg via ORAL
  Filled 2017-03-01 (×8): qty 1

## 2017-03-01 MED ORDER — OXYTOCIN 40 UNITS IN LACTATED RINGERS INFUSION - SIMPLE MED
2.5000 [IU]/h | INTRAVENOUS | Status: DC
  Administered 2017-03-01: 2.5 [IU]/h via INTRAVENOUS

## 2017-03-01 MED ORDER — LACTATED RINGERS IV SOLN: 500.0000 mL | Freq: Once | INTRAVENOUS | Status: DC

## 2017-03-01 MED ORDER — OXYTOCIN BOLUS FROM INFUSION
500.0000 mL | Freq: Once | INTRAVENOUS | Status: AC
  Administered 2017-03-01: 500 mL via INTRAVENOUS

## 2017-03-01 MED ORDER — PHENYLEPHRINE 40 MCG/ML (10ML) SYRINGE FOR IV PUSH (FOR BLOOD PRESSURE SUPPORT)
80.0000 ug | PREFILLED_SYRINGE | INTRAVENOUS | Status: DC | PRN
  Filled 2017-03-01: qty 5

## 2017-03-01 MED ORDER — BUTORPHANOL TARTRATE 1 MG/ML IJ SOLN: 1.0000 mg | INTRAMUSCULAR | Status: DC | PRN

## 2017-03-01 MED ORDER — EPHEDRINE 5 MG/ML INJ
10.0000 mg | INTRAVENOUS | Status: DC | PRN
  Filled 2017-03-01: qty 2

## 2017-03-01 MED ORDER — METHYLERGONOVINE MALEATE 0.2 MG PO TABS
0.2000 mg | ORAL_TABLET | ORAL | Status: DC | PRN
  Filled 2017-03-01: qty 1

## 2017-03-01 MED ORDER — PRENATAL MULTIVITAMIN CH
1.0000 | ORAL_TABLET | Freq: Every day | ORAL | Status: DC
  Administered 2017-03-02 – 2017-03-03 (×2): 1 via ORAL
  Filled 2017-03-01 (×2): qty 1

## 2017-03-01 MED ORDER — DIPHENHYDRAMINE HCL 50 MG/ML IJ SOLN
12.5000 mg | INTRAMUSCULAR | Status: DC | PRN
Start: 2017-03-01 — End: 2017-03-01

## 2017-03-01 MED ORDER — OXYTOCIN 10 UNIT/ML IJ SOLN
INTRAMUSCULAR | Status: AC
  Filled 2017-03-01: qty 2

## 2017-03-01 MED ORDER — OXYCODONE-ACETAMINOPHEN 5-325 MG PO TABS: 1.0000 | ORAL_TABLET | ORAL | Status: DC | PRN

## 2017-03-01 MED ORDER — OXYTOCIN 40 UNITS IN LACTATED RINGERS INFUSION - SIMPLE MED
INTRAVENOUS | Status: AC
  Filled 2017-03-01: qty 1000

## 2017-03-01 MED ORDER — ZOLPIDEM TARTRATE 5 MG PO TABS: 5.0000 mg | ORAL_TABLET | Freq: Every evening | ORAL | Status: DC | PRN

## 2017-03-01 MED ORDER — WITCH HAZEL-GLYCERIN EX PADS: 1.0000 "application " | MEDICATED_PAD | CUTANEOUS | Status: DC | PRN

## 2017-03-01 MED ORDER — OXYCODONE-ACETAMINOPHEN 5-325 MG PO TABS: 2.0000 | ORAL_TABLET | ORAL | Status: DC | PRN

## 2017-03-01 MED ORDER — ONDANSETRON HCL 4 MG/2ML IJ SOLN: 4.0000 mg | Freq: Four times a day (QID) | INTRAMUSCULAR | Status: DC | PRN

## 2017-03-01 MED ORDER — PENICILLIN G POT IN DEXTROSE 60000 UNIT/ML IV SOLN
3.0000 10*6.[IU] | INTRAVENOUS | Status: DC
  Administered 2017-03-01: 3 10*6.[IU] via INTRAVENOUS
  Filled 2017-03-01 (×5): qty 50

## 2017-03-01 MED ORDER — METHYLERGONOVINE MALEATE 0.2 MG/ML IJ SOLN: 0.2000 mg | INTRAMUSCULAR | Status: DC | PRN

## 2017-03-01 MED ORDER — BENZOCAINE-MENTHOL 20-0.5 % EX AERO: 1.0000 "application " | INHALATION_SPRAY | CUTANEOUS | Status: DC | PRN

## 2017-03-01 MED ORDER — LACTATED RINGERS IV SOLN
500.0000 mL | INTRAVENOUS | Status: DC | PRN
  Administered 2017-03-01: 500 mL via INTRAVENOUS

## 2017-03-01 MED ORDER — SODIUM CHLORIDE 0.9 % IV SOLN
INTRAVENOUS | Status: DC | PRN
  Administered 2017-03-01 (×2): 5 mL via EPIDURAL

## 2017-03-01 MED ORDER — LIDOCAINE-EPINEPHRINE (PF) 1.5 %-1:200000 IJ SOLN
INTRAMUSCULAR | Status: DC | PRN
  Administered 2017-03-01: 3 mL via EPIDURAL

## 2017-03-01 MED ORDER — PENICILLIN G POT IN DEXTROSE 60000 UNIT/ML IV SOLN
3.0000 10*6.[IU] | INTRAVENOUS | Status: DC
  Filled 2017-03-01 (×3): qty 50

## 2017-03-01 MED ORDER — FENTANYL 2.5 MCG/ML W/ROPIVACAINE 0.15% IN NS 100 ML EPIDURAL (ARMC)
EPIDURAL | Status: AC
  Filled 2017-03-01: qty 100

## 2017-03-01 MED ORDER — MISOPROSTOL 50MCG HALF TABLET
50.0000 ug | ORAL_TABLET | ORAL | Status: DC
  Administered 2017-03-01: 50 ug via VAGINAL
  Filled 2017-03-01 (×2): qty 1

## 2017-03-01 MED ORDER — DIBUCAINE 1 % RE OINT: 1.0000 "application " | TOPICAL_OINTMENT | RECTAL | Status: DC | PRN

## 2017-03-01 MED ORDER — LIDOCAINE HCL (PF) 1 % IJ SOLN
30.0000 mL | INTRAMUSCULAR | Status: AC | PRN
  Administered 2017-03-01: 1 mL via SUBCUTANEOUS

## 2017-03-01 MED ORDER — LIDOCAINE HCL (PF) 1 % IJ SOLN
INTRAMUSCULAR | Status: AC
  Filled 2017-03-01: qty 30

## 2017-03-01 MED ORDER — SIMETHICONE 80 MG PO CHEW: 80.0000 mg | CHEWABLE_TABLET | ORAL | Status: DC | PRN

## 2017-03-01 MED ORDER — DEXTROSE 5 % IV SOLN
5.0000 10*6.[IU] | Freq: Once | INTRAVENOUS | Status: AC
  Administered 2017-03-01: 5 10*6.[IU] via INTRAVENOUS
  Filled 2017-03-01: qty 5

## 2017-03-01 MED ORDER — AMMONIA AROMATIC IN INHA
RESPIRATORY_TRACT | Status: AC
  Filled 2017-03-01: qty 10

## 2017-03-01 MED ORDER — FERROUS SULFATE 325 (65 FE) MG PO TABS
325.0000 mg | ORAL_TABLET | Freq: Every day | ORAL | Status: DC
  Administered 2017-03-02 – 2017-03-03 (×2): 325 mg via ORAL
  Filled 2017-03-01 (×2): qty 1

## 2017-03-01 MED ORDER — FLEET ENEMA 7-19 GM/118ML RE ENEM: 1.0000 | ENEMA | Freq: Every day | RECTAL | Status: DC | PRN

## 2017-03-01 MED ORDER — ACETAMINOPHEN 325 MG PO TABS: 650.0000 mg | ORAL_TABLET | ORAL | Status: DC | PRN

## 2017-03-01 MED ORDER — ONDANSETRON HCL 4 MG PO TABS: 4.0000 mg | ORAL_TABLET | ORAL | Status: DC | PRN

## 2017-03-01 MED ORDER — SOD CITRATE-CITRIC ACID 500-334 MG/5ML PO SOLN: 30.0000 mL | ORAL | Status: DC | PRN

## 2017-03-01 MED ORDER — FENTANYL 2.5 MCG/ML W/ROPIVACAINE 0.15% IN NS 100 ML EPIDURAL (ARMC)
12.0000 mL/h | EPIDURAL | Status: DC
  Administered 2017-03-01: 12 mL/h via EPIDURAL

## 2017-03-01 MED ORDER — ONDANSETRON HCL 4 MG/2ML IJ SOLN: 4.0000 mg | INTRAMUSCULAR | Status: DC | PRN

## 2017-03-01 MED ORDER — OXYTOCIN 40 UNITS IN LACTATED RINGERS INFUSION - SIMPLE MED
1.0000 m[IU]/min | INTRAVENOUS | Status: DC
  Administered 2017-03-01: 1 m[IU]/min via INTRAVENOUS
  Administered 2017-03-01 (×2): 2 m[IU]/min via INTRAVENOUS

## 2017-03-01 MED ORDER — MISOPROSTOL 200 MCG PO TABS
ORAL_TABLET | ORAL | Status: AC
  Filled 2017-03-01: qty 4

## 2017-03-01 MED ORDER — LACTATED RINGERS IV SOLN
INTRAVENOUS | Status: DC
  Administered 2017-03-01 (×2): via INTRAVENOUS

## 2017-03-01 NOTE — Anesthesia Preprocedure Evaluation (Signed)
Anesthesia Evaluation  Patient identified by MRN, date of birth, ID band Patient awake    Reviewed: Allergy & Precautions, H&P , NPO status , Patient's Chart, lab work & pertinent test results  History of Anesthesia Complications Negative for: history of anesthetic complications  Airway Mallampati: III  TM Distance: >3 FB Neck ROM: full    Dental  (+) Chipped   Pulmonary neg pulmonary ROS, neg shortness of breath,           Cardiovascular Exercise Tolerance: Good (-) hypertensionnegative cardio ROS       Neuro/Psych  Headaches,    GI/Hepatic negative GI ROS,   Endo/Other  Morbid obesity  Renal/GU   negative genitourinary   Musculoskeletal   Abdominal   Peds  Hematology negative hematology ROS (+)   Anesthesia Other Findings Past Medical History: No date: Dysmenorrhea No date: Migraines No date: Obesity  Past Surgical History: No date: none  BMI    Body Mass Index:  46.00 kg/m      Reproductive/Obstetrics (+) Pregnancy                             Anesthesia Physical Anesthesia Plan  ASA: III  Anesthesia Plan: Epidural   Post-op Pain Management:    Induction:   PONV Risk Score and Plan:   Airway Management Planned:   Additional Equipment:   Intra-op Plan:   Post-operative Plan:   Informed Consent: I have reviewed the patients History and Physical, chart, labs and discussed the procedure including the risks, benefits and alternatives for the proposed anesthesia with the patient or authorized representative who has indicated his/her understanding and acceptance.     Plan Discussed with: Anesthesiologist  Anesthesia Plan Comments:         Anesthesia Quick Evaluation

## 2017-03-01 NOTE — Plan of Care (Signed)
Alert and oriented with cheerful affect. Color good, skin w&d. BBS clear. Assisted up to Bathroom and voided without difficulty. Steady gait. Denies syncope. Fundus is Firm at U/E, Lochial flow is small. C/O  cramping pain at a "1" in lower abdomen that increases with Breast feeding. Denies any other c/o. V/O of Infant Safety and Security as well as calling for assistance when up put of bed while she has an IV. Appears to be Bondind well with Infant and is able to obtain an effective Latch independently.

## 2017-03-01 NOTE — Progress Notes (Signed)
LABOR NOTE   Alicia CreaseSarah D Howard 27 y.o.GP@ at 1947w0d Early latent labor.  SUBJECTIVE:  Becoming more uncomfortable, feels some pressure.  OBJECTIVE:  BP 132/83   Pulse 69   Temp 98.8 F (37.1 C) (Oral)   Resp 20   Ht 5\' 4"  (1.626 m)   Wt 268 lb (121.6 kg)   LMP 05/25/2016 (Exact Date)   BMI 46.00 kg/m  No intake/output data recorded.  She has shown cervical change. CERVIX: 5.5 per RN exam:   SVE:   Dilation: 5.5 Effacement (%): 70 Station: -2 Exam by:: S.Doldron RN  CONTRACTIONS: regular, every 1-2 minutes FHR: Fetal heart tracing reviewed. Baseline: 130 bpm, Variability: Good {> 6 bpm), Accelerations: Reactive and Decelerations: Early Category I   Analgesia: Labor support without medications  Labs: Lab Results  Component Value Date   WBC 9.9 03/01/2017   HGB 12.0 03/01/2017   HCT 35.4 03/01/2017   MCV 89.2 03/01/2017   PLT 213 03/01/2017    ASSESSMENT: 1) Labor curve reviewed.       Progress: Early latent labor.     Membranes: ruptured, meconium        Active Problems:   Encounter for induction of labor Body mass index is 46 kg/m.   PLAN: continue present management, discussed placement of IUPC at next exam if no/minimal change  Doreene Burkennie Paton Crum, CNM  03/01/2017 1:00 PM

## 2017-03-01 NOTE — Anesthesia Procedure Notes (Signed)
Epidural Patient location during procedure: OB Start time: 03/01/2017 2:00 PM End time: 03/01/2017 2:04 PM  Staffing Anesthesiologist: Cailen Texeira, Cleda MccreedyJoseph K, MD Performed: anesthesiologist   Preanesthetic Checklist Completed: patient identified, site marked, surgical consent, pre-op evaluation, timeout performed, IV checked, risks and benefits discussed and monitors and equipment checked  Epidural Patient position: sitting Prep: Betadine Patient monitoring: heart rate, continuous pulse ox and blood pressure Approach: midline Location: L3-L4 Injection technique: LOR saline  Needle:  Needle type: Tuohy  Needle gauge: 17 G Needle length: 9 cm and 9 Needle insertion depth: 7 cm Catheter type: closed end flexible Catheter size: 19 Gauge Catheter at skin depth: 12 cm Test dose: negative and 1.5% lidocaine with Epi 1:200 K  Assessment Sensory level: Level not checked as patient had already delivered. Events: blood not aspirated, injection not painful, no injection resistance, negative IV test and no paresthesia  Additional Notes 1 attempt. Patient delivered after epidural PCEA infusion was started.  Drip started at 1411, patient delivered at 1412  Pt. Evaluated and documentation done after procedure finished. Patient identified. Risks/Benefits/Options discussed with patient including but not limited to bleeding, infection, nerve damage, paralysis, failed block, incomplete pain control, headache, blood pressure changes, nausea, vomiting, reactions to medication both or allergic, itching and postpartum back pain. Confirmed with bedside nurse the patient's most recent platelet count. Confirmed with patient that they are not currently taking any anticoagulation, have any bleeding history or any family history of bleeding disorders. Patient expressed understanding and wished to proceed. All questions were answered. Sterile technique was used throughout the entire procedure. Please see nursing  notes for vital signs. Test dose was given through epidural catheter and negative prior to continuing to dose epidural or start infusion. Warning signs of high block given to the patient including shortness of breath, tingling/numbness in hands, complete motor block, or any concerning symptoms with instructions to call for help. Patient was given instructions on fall risk and not to get out of bed. All questions and concerns addressed with instructions to call with any issues or inadequate analgesia.   Patient tolerated the insertion well without immediate complications.Reason for block:procedure for pain

## 2017-03-01 NOTE — H&P (Signed)
History and Physical   HPI  Alicia Howard is a 27 y.o. G3P1011 at [redacted]w[redacted]d Estimated Date of Delivery: 03/01/17 who is being admitted for  induction of labor   OB History  Obstetric History   G3   P1   T1   P0   A1   L1    SAB1   TAB0   Ectopic0   Multiple0   Live Births1     # Outcome Date GA Lbr Len/2nd Weight Sex Delivery Anes PTL Lv  3 Current           2 SAB 2017 [redacted]w[redacted]d   U SAB  N LIV  1 Term 2014    M Vag-Spont   LIV      PROBLEM LIST  Pregnancy complications or risks: Patient Active Problem List   Diagnosis Date Noted  . Encounter for induction of labor 03/01/2017  . Group beta Strep positive 02/10/2017  . Vitamin D deficiency 03/16/2015    Prenatal labs and studies: ABO, Rh: --/--/O POS (01/19 1610) Antibody: NEG (01/19 0647) Rubella: 1.02 (07/02 0909) RPR: Non Reactive (07/02 0909)  HBsAg: Negative (07/02 0909)  HIV: Non Reactive (07/02 0909)  RUE:[id number] (12/27 1650)   Past Medical History:  Diagnosis Date  . Dysmenorrhea   . Migraines   . Obesity      Past Surgical History:  Procedure Laterality Date  . none       Medications    Current Discharge Medication List    CONTINUE these medications which have NOT CHANGED   Details  doxylamine, Sleep, (UNISOM) 25 MG tablet Take 25 mg by mouth at bedtime as needed.    magnesium 30 MG tablet Take 30 mg by mouth 2 (two) times daily.    ondansetron (ZOFRAN-ODT) 4 MG disintegrating tablet TAKE 1 TABLET BY MOUTH EVERY 6 HOURS AS NEEDED FOR NAUSEA. MAX PER INS Qty: 20 tablet, Refills: 4    Prenatal Vit-Fe Fumarate-FA (PRENATAL MULTIVITAMIN) TABS tablet Take 1 tablet by mouth daily at 12 noon.    pyridOXINE (VITAMIN B-6) 25 MG tablet Take 25 mg by mouth daily.         Allergies  Latex  Review of Systems  Constitutional: negative Eyes: negative Ears, nose, mouth, throat, and face: negative Respiratory: negative Cardiovascular: negative Gastrointestinal:  negative Genitourinary:negative Integument/breast: negative Hematologic/lymphatic: negative Musculoskeletal:negative Neurological: negative Behavioral/Psych: negative Endocrine: negative Allergic/Immunologic: negative  Physical Exam  BP (!) 141/74   Pulse 95   Temp 98 F (36.7 C) (Oral)   Resp 18   Ht 5\' 4"  (1.626 m)   Wt 268 lb (121.6 kg)   LMP 05/25/2016 (Exact Date)   BMI 46.00 kg/m   Lungs:  CTA B Cardio: RRR without M/R/G Abd: Soft, gravid, NT Presentation: cephalic EXT: No C/C/ 1+ Edema DTRs: 2+ B CERVIX: 5/70/-2 See Prenatal records for more detailed PE.     FHR:  Baseline: 135 bpm, Variability: Good {> 6 bpm), Accelerations: Reactive and Decelerations: Absent  Toco: Uterine Contractions: Frequency: Every 2-5 minutes, Duration: 50-70 seconds and Intensity: mild   Test Results  Results for orders placed or performed during the hospital encounter of 03/01/17 (from the past 24 hour(s))  CBC     Status: Abnormal   Collection Time: 03/01/17  6:47 AM  Result Value Ref Range   WBC 9.9 3.6 - 11.0 K/uL   RBC 3.97 3.80 - 5.20 MIL/uL   Hemoglobin 12.0 12.0 - 16.0 g/dL   HCT 91.4 78.2 -  47.0 %   MCV 89.2 80.0 - 100.0 fL   MCH 30.1 26.0 - 34.0 pg   MCHC 33.8 32.0 - 36.0 g/dL   RDW 40.915.5 (H) 81.111.5 - 91.414.5 %   Platelets 213 150 - 440 K/uL  Type and screen     Status: None   Collection Time: 03/01/17  6:47 AM  Result Value Ref Range   ABO/RH(D) O POS    Antibody Screen NEG    Sample Expiration      03/04/2017 Performed at [facility], [address] [place], [place] [address]    Group B Strep positive  Assessment   G3P1011 at 8423w0d Estimated Date of Delivery: 03/01/17  The fetus is reassuring.   Patient Active Problem List   Diagnosis Date Noted  . Encounter for induction of labor 03/01/2017  . Group beta Strep positive 02/10/2017  . Vitamin D deficiency 03/16/2015    Plan  1. Admit to L&D :   continue present management of Cytotec.  AROM, clear-pink tinged.  2. EFM:-- Category 1 3. Epidural if desired. Stadol for IV pain until epidural requested. She is planing to do it without medications 4. Admission labs -completed 5. Anticipate NSVD  Doreene Burkennie Donnae Michels, CNM  03/01/2017 9:05 AM

## 2017-03-02 LAB — CBC
HEMATOCRIT: 34 % — AB (ref 35.0–47.0)
HEMOGLOBIN: 11.4 g/dL — AB (ref 12.0–16.0)
MCH: 30.1 pg (ref 26.0–34.0)
MCHC: 33.6 g/dL (ref 32.0–36.0)
MCV: 89.4 fL (ref 80.0–100.0)
Platelets: 195 10*3/uL (ref 150–440)
RBC: 3.8 MIL/uL (ref 3.80–5.20)
RDW: 15.9 % — ABNORMAL HIGH (ref 11.5–14.5)
WBC: 11.3 10*3/uL — ABNORMAL HIGH (ref 3.6–11.0)

## 2017-03-02 NOTE — Progress Notes (Signed)
Progress Note - Vaginal Delivery  Alicia Howard is a 27 y.o. [phone] now PP day 1 s/p Vaginal, Spontaneous .   Subjective:  The patient reports no complaints, up ad lib, voiding, tolerating PO and + flatus  Objective:  Vital signs in last 24 hours: Temp:  [97.7 F (36.5 C)-98.8 F (37.1 C)] 98.1 F (36.7 C) (01/20 0742) Pulse Rate:  [69-100] 91 (01/20 0742) Resp:  [18-20] 18 (01/20 0742) BP: (122-141)/(68-83) 141/81 (01/20 0742) SpO2:  [98 %-99 %] 99 % (01/20 0742)  Physical Exam:  General: alert, cooperative, appears stated age, fatigued and no distress  Lungs clear bilaterally Heart: RRR Lochia: appropriate Uterine Fundus: Firm at U DVT Evaluation: No evidence of DVT seen on physical exam. No cords or calf tenderness. No significant calf/ankle edema.    Data Review Recent Labs    03/01/17 0647 03/02/17 0622  HGB 12.0 11.4*  HCT 35.4 34.0*    Assessment/Plan: Active Problems:   Encounter for induction of labor   Plan for discharge tomorrow, baby is having some issues with gaging and had to be observed in the nursery through the night. She would like to stay for continued observation of the baby.   -- Continue routine PP care.     Doreene Burkennie Hasan Douse, CNM  03/02/2017 10:01 AM

## 2017-03-02 NOTE — Anesthesia Postprocedure Evaluation (Signed)
Anesthesia Post Note  Patient: Alicia CreaseSarah D Howard  Procedure(s) Performed: AN AD HOC LABOR EPIDURAL  Patient location during evaluation: Mother Baby Anesthesia Type: Epidural Level of consciousness: awake and alert Pain management: pain level controlled Vital Signs Assessment: post-procedure vital signs reviewed and stable Respiratory status: spontaneous breathing, nonlabored ventilation and respiratory function stable Cardiovascular status: stable Postop Assessment: no headache, no backache and epidural receding Anesthetic complications: no     Last Vitals:  Vitals:   03/02/17 0742 03/02/17 1700  BP: (!) 141/81 114/82  Pulse: 91 96  Resp: 18 20  Temp: 36.7 C 37.1 C  SpO2: 99% 97%    Last Pain:  Vitals:   03/02/17 1700  TempSrc: Oral  PainSc:                  Lenard SimmerAndrew Conner Neiss

## 2017-03-03 LAB — RPR: RPR Ser Ql: NONREACTIVE

## 2017-03-03 MED ORDER — DOCUSATE SODIUM 100 MG PO CAPS: 100.0000 mg | ORAL_CAPSULE | Freq: Two times a day (BID) | ORAL | 0 refills | Status: DC

## 2017-03-03 NOTE — Final Progress Note (Addendum)
                            Discharge Summary  Date of Admission: 03/01/2017  Date of Discharge: 03/03/2017  Admitting Diagnosis: Induction of labor at 3174w0d  Mode of Delivery: normal spontaneous vaginal delivery                 Discharge Diagnosis: No other diagnosis   Intrapartum Procedures: GBS prophylaxis   Post partum procedures: none  Complications: none                      Discharge Day SOAP Note:  Progress Note - Vaginal Delivery  Alicia CreaseSarah D Howard is a 27 y.o. [id number] now PP day 2 s/p Vaginal, Spontaneous . Delivery was uncomplicated  Subjective  The patient has the following complaints: has no unusual complaints  Pain is controlled with current medications.   Patient is urinating without difficulty.  She is ambulating well.    Objective  Vital signs: BP 120/79 (BP Location: Right Arm)   Pulse 100   Temp 98 F (36.7 C) (Oral)   Resp 20   Ht 5\' 4"  (1.626 m)   Wt 268 lb (121.6 kg)   LMP 05/25/2016 (Exact Date)   SpO2 100%   Breastfeeding? Unknown   BMI 46.00 kg/m   Physical Exam: Gen: NAD Fundus Fundal Tone: Firm  Lochia Amount: Scant  Perineum Appearance: Edematous     Data Review Labs: CBC Latest Ref Rng & Units 03/02/2017 03/01/2017 12/09/2016  WBC 3.6 - 11.0 K/uL 11.3(H) 9.9 14.0(H)  Hemoglobin 12.0 - 16.0 g/dL 11.4(L) 12.0 12.2  Hematocrit 35.0 - 47.0 % 34.0(L) 35.4 37.6  Platelets 150 - 440 K/uL 195 213 246   O POS  Assessment/Plan  Active Problems:   Encounter for induction of labor    Plan for discharge today.   Discharge Instructions: Per After Visit Summary. Activity: Advance as tolerated. Pelvic rest for 6 weeks.  Also refer to After Visit Summary Diet: Regular Medications: Allergies as of 03/03/2017      Reactions   Latex Dermatitis      Medication List    TAKE these medications   docusate sodium 100 MG capsule Commonly known as:  COLACE Take 1 capsule (100 mg total) by mouth 2 (two) times daily.   doxylamine (Sleep)  25 MG tablet Commonly known as:  UNISOM Take 25 mg by mouth at bedtime as needed.   magnesium 30 MG tablet Take 30 mg by mouth 2 (two) times daily.   ondansetron 4 MG disintegrating tablet Commonly known as:  ZOFRAN-ODT TAKE 1 TABLET BY MOUTH EVERY 6 HOURS AS NEEDED FOR NAUSEA. MAX PER INS   prenatal multivitamin Tabs tablet Take 1 tablet by mouth daily at 12 noon.   pyridOXINE 25 MG tablet Commonly known as:  VITAMIN B-6 Take 25 mg by mouth daily.      Outpatient follow up:  Postpartum contraception:Husband is planning on vasectomy. Encouraged nothing in the vaginal for 6 wks and use of condoms until procedure.   Discharged Condition: good  Discharged to: home  Newborn Data: Disposition:may be staying , increaced bilirubin Waiting for lab results  Apgars: APGAR (1 MIN): 8   APGAR (5 MINS): 9   APGAR (10 MINS):    Baby Feeding: Breast    Doreene Burkennie Conway Fedora, CNM  03/03/2017 8:48 AM

## 2017-03-03 NOTE — Progress Notes (Signed)
Patient discharged home with infant. Discharge instructions, prescriptions and follow up appointment given to and reviewed with patient. Patient verbalized understanding. Patient wheeled out with infant by auxiliary.  

## 2017-03-03 NOTE — Discharge Summary (Signed)
Discharge Day SOAP Note:  Progress Note - Vaginal Delivery  Alicia Howard is a 27 y.o. [id number] now PP day 2 s/p Vaginal, Spontaneous . Delivery was uncomplicated  Subjective  The patient has the following complaints: has no unusual complaints  Pain is controlled with current medications.   Patient is urinating without difficulty.  She is ambulating well.    Objective  Vital signs: BP 120/79 (BP Location: Right Arm)   Pulse 100   Temp 98 F (36.7 C) (Oral)   Resp 20   Ht 5\' 4"  (1.626 m)   Wt 268 lb (121.6 kg)   LMP 05/25/2016 (Exact Date)   SpO2 100%   Breastfeeding? Unknown   BMI 46.00 kg/m   Physical Exam: Gen: NAD Fundus Fundal Tone: Firm  Lochia Amount: Scant  Perineum Appearance: Edematous     Data Review Labs: CBC Latest Ref Rng & Units 03/02/2017 03/01/2017 12/09/2016  WBC 3.6 - 11.0 K/uL 11.3(H) 9.9 14.0(H)  Hemoglobin 12.0 - 16.0 g/dL 11.4(L) 12.0 12.2  Hematocrit 35.0 - 47.0 % 34.0(L) 35.4 37.6  Platelets 150 - 440 K/uL 195 213 246   O POS  Assessment/Plan  Active Problems:   Encounter for induction of labor    Plan for discharge today.   Discharge Instructions: Per After Visit Summary. Activity: Advance as tolerated. Pelvic rest for 6 weeks.  Also refer to After Visit Summary Diet: Regular Medications: Allergies as of 03/03/2017      Reactions   Latex Dermatitis      Medication List    TAKE these medications   docusate sodium 100 MG capsule Commonly known as:  COLACE Take 1 capsule (100 mg total) by mouth 2 (two) times daily.   doxylamine (Sleep) 25 MG tablet Commonly known as:  UNISOM Take 25 mg by mouth at bedtime as needed.   magnesium 30 MG tablet Take 30 mg by mouth 2 (two) times daily.   ondansetron 4 MG disintegrating tablet Commonly known as:  ZOFRAN-ODT TAKE 1 TABLET BY MOUTH EVERY 6 HOURS AS NEEDED FOR NAUSEA. MAX PER INS   prenatal multivitamin Tabs tablet Take 1 tablet by mouth daily at 12 noon.   pyridOXINE  25 MG tablet Commonly known as:  VITAMIN B-6 Take 25 mg by mouth daily.      Outpatient follow up:  Postpartum contraception:Husband is planning on vasectomy. Encouraged nothing in the vaginal for 6 wks and use of condoms until procedure.   Discharged Condition: good  Discharged to: home  Newborn Data: Disposition:may be staying , increaced bilirubin Waiting for lab results  Apgars: APGAR (1 MIN): 8   APGAR (5 MINS): 9   APGAR (10 MINS):    Baby Feeding: Breast    Doreene Burkennie Britanie Harshman, CNM  03/03/2017 8:48 AM

## 2017-03-24 ENCOUNTER — Encounter: Payer: Self-pay | Admitting: Obstetrics and Gynecology

## 2017-04-14 ENCOUNTER — Ambulatory Visit (INDEPENDENT_AMBULATORY_CARE_PROVIDER_SITE_OTHER): Payer: Managed Care, Other (non HMO) | Admitting: Certified Nurse Midwife

## 2017-04-14 NOTE — Patient Instructions (Signed)
Preventive Care 18-39 Years, Female Preventive care refers to lifestyle choices and visits with your health care provider that can promote health and wellness. What does preventive care include?  A yearly physical exam. This is also called an annual well check.  Dental exams once or twice a year.  Routine eye exams. Ask your health care provider how often you should have your eyes checked.  Personal lifestyle choices, including: ? Daily care of your teeth and gums. ? Regular physical activity. ? Eating a healthy diet. ? Avoiding tobacco and drug use. ? Limiting alcohol use. ? Practicing safe sex. ? Taking vitamin and mineral supplements as recommended by your health care provider. What happens during an annual well check? The services and screenings done by your health care provider during your annual well check will depend on your age, overall health, lifestyle risk factors, and family history of disease. Counseling Your health care provider may ask you questions about your:  Alcohol use.  Tobacco use.  Drug use.  Emotional well-being.  Home and relationship well-being.  Sexual activity.  Eating habits.  Work and work Statistician.  Method of birth control.  Menstrual cycle.  Pregnancy history.  Screening You may have the following tests or measurements:  Height, weight, and BMI.  Diabetes screening. This is done by checking your blood sugar (glucose) after you have not eaten for a while (fasting).  Blood pressure.  Lipid and cholesterol levels. These may be checked every 5 years starting at age 66.  Skin check.  Hepatitis C blood test.  Hepatitis B blood test.  Sexually transmitted disease (STD) testing.  BRCA-related cancer screening. This may be done if you have a family history of breast, ovarian, tubal, or peritoneal cancers.  Pelvic exam and Pap test. This may be done every 3 years starting at age 40. Starting at age 59, this may be done every 5  years if you have a Pap test in combination with an HPV test.  Discuss your test results, treatment options, and if necessary, the need for more tests with your health care provider. Vaccines Your health care provider may recommend certain vaccines, such as:  Influenza vaccine. This is recommended every year.  Tetanus, diphtheria, and acellular pertussis (Tdap, Td) vaccine. You may need a Td booster every 10 years.  Varicella vaccine. You may need this if you have not been vaccinated.  HPV vaccine. If you are 69 or younger, you may need three doses over 6 months.  Measles, mumps, and rubella (MMR) vaccine. You may need at least one dose of MMR. You may also need a second dose.  Pneumococcal 13-valent conjugate (PCV13) vaccine. You may need this if you have certain conditions and were not previously vaccinated.  Pneumococcal polysaccharide (PPSV23) vaccine. You may need one or two doses if you smoke cigarettes or if you have certain conditions.  Meningococcal vaccine. One dose is recommended if you are age 27-21 years and a first-year college student living in a residence hall, or if you have one of several medical conditions. You may also need additional booster doses.  Hepatitis A vaccine. You may need this if you have certain conditions or if you travel or work in places where you may be exposed to hepatitis A.  Hepatitis B vaccine. You may need this if you have certain conditions or if you travel or work in places where you may be exposed to hepatitis B.  Haemophilus influenzae type b (Hib) vaccine. You may need this if  you have certain risk factors.  Talk to your health care provider about which screenings and vaccines you need and how often you need them. This information is not intended to replace advice given to you by your health care provider. Make sure you discuss any questions you have with your health care provider. Document Released: 03/26/2001 Document Revised: 10/18/2015  Document Reviewed: 11/29/2014 Elsevier Interactive Patient Education  [employer].

## 2017-04-14 NOTE — Progress Notes (Signed)
Pt is here for a post partum visit. Is breast feeding, has not resumed intercourse, husband had vasectomy,has not had a period.

## 2017-04-14 NOTE — Progress Notes (Signed)
Subjective:    Alicia CreaseSarah D Howard is a 27 y.o. 533P2012 Caucasian female who presents for a postpartum visit. She is 6 weeks postpartum following a spontaneous vaginal delivery at 40 gestational weeks. Anesthesia: epidural. I have fully reviewed the prenatal and intrapartum course. Postpartum course has been WNL. Baby's course has been WNL. Baby is feeding by breast. Bleeding no bleeding. Bowel function is normal. Bladder function is normal. Patient is not sexually active. Last sexual activity: prior to delivery. Contraception method is vasectomy. Postpartum depression screening: negative. Score 3.  Last pap 04/11/16 and was negative.  The following portions of the patient's history were reviewed and updated as appropriate: allergies, current medications, past medical history, past surgical history and problem list.  Review of Systems Pertinent items are noted in HPI.   Vitals:   04/14/17 1522  BP: 114/76  Pulse: 77  Weight: 241 lb (109.3 kg)  Height: 5\' 4"  (1.626 m)   No LMP recorded.  Objective:   General:  alert, cooperative and no distress   Breasts:  deferred, no complaints  Lungs: clear to auscultation bilaterally  Heart:  regular rate and rhythm  Abdomen: soft, nontender   Vulva: normal  Vagina: normal vagina  Cervix:  closed  Corpus: Well-involuted  Adnexa:  Non-palpable  Rectal Exam: No hemorrhoids        Assessment:   Postpartum exam 6 wks s/p SVD Breast feeding Depression screening Contraception counseling   Plan:  : vasectomy Follow up in: 6 months for annual or earlier if needed  Doreene BurkeAnnie Martavius Lusty, CNM

## 2017-04-17 ENCOUNTER — Encounter: Payer: Managed Care, Other (non HMO) | Admitting: Obstetrics and Gynecology

## 2017-10-23 ENCOUNTER — Encounter: Payer: Managed Care, Other (non HMO) | Admitting: Obstetrics and Gynecology

## 2017-10-30 ENCOUNTER — Ambulatory Visit (INDEPENDENT_AMBULATORY_CARE_PROVIDER_SITE_OTHER): Payer: Managed Care, Other (non HMO) | Admitting: Obstetrics and Gynecology

## 2017-10-30 ENCOUNTER — Encounter: Payer: Self-pay | Admitting: Obstetrics and Gynecology

## 2017-10-30 VITALS — BP 119/80 | HR 88 | Ht 64.0 in | Wt 256.9 lb

## 2017-10-30 DIAGNOSIS — Z01419 Encounter for gynecological examination (general) (routine) without abnormal findings: Secondary | ICD-10-CM | POA: Diagnosis not present

## 2017-10-30 DIAGNOSIS — E559 Vitamin D deficiency, unspecified: Secondary | ICD-10-CM

## 2017-10-30 DIAGNOSIS — N920 Excessive and frequent menstruation with regular cycle: Secondary | ICD-10-CM | POA: Diagnosis not present

## 2017-10-30 MED ORDER — TRANEXAMIC ACID 650 MG PO TABS: 1300.0000 mg | ORAL_TABLET | Freq: Three times a day (TID) | ORAL | 2 refills | Status: DC

## 2017-10-30 MED ORDER — NAPROXEN 500 MG PO TABS: 500.0000 mg | ORAL_TABLET | Freq: Two times a day (BID) | ORAL | 2 refills | Status: DC

## 2017-10-30 NOTE — Patient Instructions (Signed)
Place annual gynecologic exam patient instructions here.

## 2017-10-30 NOTE — Progress Notes (Signed)
Subjective:     Alicia Howard is a married white 27 y.o. female and is here for a comprehensive physical exam. G2P2. Is sexually active with female spouse. Vasectomy done last year. 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 The patient reports no problems.  Social History   Socioeconomic History  . Marital status: Married    Spouse name: Not on file  . Number of children: Not on file  . Years of education: Not on file  . Highest education level: Not on file  Occupational History  . Not on file  Social Needs  . Financial resource strain: Not on file  . Food insecurity:    Worry: Not on file    Inability: Not on file  . Transportation needs:    Medical: Not on file    Non-medical: Not on file  Tobacco Use  . Smoking status: Never Smoker  . Smokeless tobacco: Never Used  Substance and Sexual Activity  . Alcohol use: No  . Drug use: No  . Sexual activity: Yes    Birth control/protection: Other-see comments    Comment: vasectomy  Lifestyle  . Physical activity:    Days per week: 0 days    Minutes per session: 0 min  . Stress: Not on file  Relationships  . Social connections:    Talks on phone: Not on file    Gets together: Not on file    Attends religious service: Not on file    Active member of club or organization: Not on file    Attends meetings of clubs or organizations: Not on file    Relationship status: Not on file  . Intimate partner violence:    Fear of current or ex partner: Not on file    Emotionally abused: Not on file    Physically abused: Not on file    Forced sexual activity: Not on file  Other Topics Concern  . Not on file  Social History Narrative  . Not on file   Health Maintenance  Topic Date Due  . INFLUENZA VACCINE  09/11/2017  . PAP SMEAR  04/12/2019  . TETANUS/TDAP  01/07/2027  . HIV Screening  Completed    The following portions of the patient's history were reviewed and updated as appropriate: allergies, current medications, past family  history, past medical history, past social history, past surgical history and problem list.  Review of Systems A comprehensive review of systems was negative.   Objective:    General appearance: alert, cooperative, appears stated age and moderately obese Neck: no adenopathy, no carotid bruit, no JVD, supple, symmetrical, trachea midline and thyroid not enlarged, symmetric, no tenderness/mass/nodules Lungs: clear to auscultation bilaterally Breasts: normal appearance, no masses or tenderness Heart: regular rate and rhythm, S1, S2 normal, no murmur, click, rub or gallop Abdomen: soft, non-tender; bowel sounds normal; no masses,  no organomegaly Pelvic: cervix normal in appearance, no adnexal masses or tenderness, no cervical motion tenderness, rectovaginal septum normal, uterus normal size, shape, and consistency and vagina normal without discharge    Assessment:    Healthy female exam.  BMI 44    Menstrual migraines Menorrhagia    Plan:  Previous Lysteda and Naproxen refilled per patient request. Discussed possible depo use for menorrahgia and migraines but declines at this time. Encouraged healthy lifestyle and wait loss. Declined flu vaccine as she gets it at work.  RTC 1 year or as needed.   Melody Shambley,CNM   See After Visit Summary for Counseling  Recommendations

## 2017-10-31 ENCOUNTER — Telehealth: Payer: Self-pay | Admitting: *Deleted

## 2017-10-31 ENCOUNTER — Other Ambulatory Visit: Payer: Self-pay | Admitting: Obstetrics and Gynecology

## 2017-10-31 LAB — CBC
HEMOGLOBIN: 12.6 g/dL (ref 11.1–15.9)
Hematocrit: 37.8 % (ref 34.0–46.6)
MCH: 28.9 pg (ref 26.6–33.0)
MCHC: 33.3 g/dL (ref 31.5–35.7)
MCV: 87 fL (ref 79–97)
Platelets: 297 10*3/uL (ref 150–450)
RBC: 4.36 x10E6/uL (ref 3.77–5.28)
RDW: 14.1 % (ref 12.3–15.4)
WBC: 9.6 10*3/uL (ref 3.4–10.8)

## 2017-10-31 LAB — LIPID PANEL
CHOL/HDL RATIO: 2.4 ratio (ref 0.0–4.4)
Cholesterol, Total: 149 mg/dL (ref 100–199)
HDL: 61 mg/dL (ref >39)
LDL Calculated: 73 mg/dL (ref 0–99)
Triglycerides: 75 mg/dL (ref 0–149)
VLDL Cholesterol Cal: 15 mg/dL (ref 5–40)

## 2017-10-31 LAB — THYROID PANEL WITH TSH
FREE THYROXINE INDEX: 2.1 (ref 1.2–4.9)
T3 UPTAKE RATIO: 25 % (ref 24–39)
T4, Total: 8.2 ug/dL (ref 4.5–12.0)
TSH: 3.29 u[IU]/mL (ref 0.450–4.500)

## 2017-10-31 LAB — B12 AND FOLATE PANEL
Folate: >20 ng/mL (ref >3.0)
VITAMIN B 12: 548 pg/mL (ref 232–1245)

## 2017-10-31 LAB — COMPREHENSIVE METABOLIC PANEL
A/G RATIO: 1.8 (ref 1.2–2.2)
ALT: 10 IU/L (ref 0–32)
AST: 13 IU/L (ref 0–40)
Albumin: 4.6 g/dL (ref 3.5–5.5)
Alkaline Phosphatase: 89 IU/L (ref 39–117)
BUN/Creatinine Ratio: 16 (ref 9–23)
BUN: 10 mg/dL (ref 6–20)
CALCIUM: 9.4 mg/dL (ref 8.7–10.2)
CHLORIDE: 105 mmol/L (ref 96–106)
CO2: 21 mmol/L (ref 20–29)
Creatinine, Ser: 0.64 mg/dL (ref 0.57–1.00)
GFR calc non Af Amer: 121 mL/min/{1.73_m2} (ref >59)
GFR, EST AFRICAN AMERICAN: 139 mL/min/{1.73_m2} (ref >59)
GLUCOSE: 90 mg/dL (ref 65–99)
Globulin, Total: 2.6 g/dL (ref 1.5–4.5)
POTASSIUM: 4.3 mmol/L (ref 3.5–5.2)
Sodium: 143 mmol/L (ref 134–144)
TOTAL PROTEIN: 7.2 g/dL (ref 6.0–8.5)

## 2017-10-31 LAB — FERRITIN: FERRITIN: 39 ng/mL (ref 15–150)

## 2017-10-31 LAB — HEMOGLOBIN A1C
ESTIMATED AVERAGE GLUCOSE: 100 mg/dL
Hgb A1c MFr Bld: 5.1 % (ref 4.8–5.6)

## 2017-10-31 LAB — VITAMIN D 25 HYDROXY (VIT D DEFICIENCY, FRACTURES): Vit D, 25-Hydroxy: 16.9 ng/mL — ABNORMAL LOW (ref 30.0–100.0)

## 2017-10-31 LAB — MAGNESIUM: MAGNESIUM: 1.9 mg/dL (ref 1.6–2.3)

## 2017-10-31 MED ORDER — VITAMIN D (ERGOCALCIFEROL) 1.25 MG (50000 UNIT) PO CAPS: 50000.0000 [IU] | ORAL_CAPSULE | ORAL | 2 refills | Status: DC

## 2017-10-31 NOTE — Telephone Encounter (Signed)
-----   Message from Purcell NailsMelody N Shambley, [place]M sent at 10/31/2017 10:32 AM EDT ----- Please mail info on vit D def.

## 2017-10-31 NOTE — Telephone Encounter (Signed)
Mailed info to pt 

## 2018-06-07 ENCOUNTER — Other Ambulatory Visit: Payer: Self-pay | Admitting: Obstetrics and Gynecology

## 2018-06-08 ENCOUNTER — Other Ambulatory Visit: Payer: Self-pay

## 2018-06-08 MED ORDER — TRANEXAMIC ACID 650 MG PO TABS: 1300.0000 mg | ORAL_TABLET | Freq: Three times a day (TID) | ORAL | 2 refills | Status: DC

## 2018-07-01 ENCOUNTER — Ambulatory Visit (INDEPENDENT_AMBULATORY_CARE_PROVIDER_SITE_OTHER): Payer: Managed Care, Other (non HMO)

## 2018-07-01 ENCOUNTER — Other Ambulatory Visit: Payer: Self-pay

## 2018-07-01 ENCOUNTER — Ambulatory Visit: Payer: Managed Care, Other (non HMO) | Admitting: Podiatry

## 2018-07-01 ENCOUNTER — Encounter: Payer: Self-pay | Admitting: Podiatry

## 2018-07-01 VITALS — Temp 96.2°F

## 2018-07-01 DIAGNOSIS — M722 Plantar fascial fibromatosis: Secondary | ICD-10-CM

## 2018-07-01 MED ORDER — METHYLPREDNISOLONE 4 MG PO TBPK: ORAL_TABLET | ORAL | 0 refills | Status: DC

## 2018-07-01 MED ORDER — MELOXICAM 15 MG PO TABS: 15.0000 mg | ORAL_TABLET | Freq: Every day | ORAL | 3 refills | Status: DC

## 2018-07-01 NOTE — Patient Instructions (Signed)

## 2018-07-01 NOTE — Progress Notes (Signed)
Subjective:  Patient ID: Alicia Howard, female    DOB: May 04, 1990,  MRN: [id number] HPI Chief Complaint  Patient presents with  . Foot Pain    Patient presents today for right heel pain x 2-3 months.  She states "its very painful to first stand from sitting or resting and it feels like Im stepping on a knife"  She has been stretching which is painful, taking Tylenol and Ibuprofen and tried diffrent shoes with no relief    28 y.o. female presents with the above complaint.   ROS: Denies fever chills nausea vomiting muscle aches pains calf pain back pain chest pain shortness of breath.  Past Medical History:  Diagnosis Date  . Dysmenorrhea   . Migraines   . Obesity    Past Surgical History:  Procedure Laterality Date  . none      Current Outpatient Medications:  .  AIMOVIG 70 MG/ML SOAJ, INJECT 70 MG SUBCUTANEOUSLY EVERY 28 (TWENTY EIGHT) DAYS, Disp: , Rfl:  .  ketorolac (TORADOL) 10 MG tablet, TAKE 1 TABLET (10 MG TOTAL) BY MOUTH EVERY 12 (TWELVE) HOURS FOR 5 DAYS *MAX OF 5 DAYS**, Disp: , Rfl:  .  magnesium 30 MG tablet, Take 30 mg by mouth 2 (two) times daily., Disp: , Rfl:  .  meloxicam (MOBIC) 15 MG tablet, Take 1 tablet (15 mg total) by mouth daily., Disp: 30 tablet, Rfl: 3 .  methylPREDNISolone (MEDROL DOSEPAK) 4 MG TBPK tablet, 6 day dose pack - take as directed, Disp: 21 tablet, Rfl: 0 .  naproxen (NAPROSYN) 500 MG tablet, Take 1 tablet (500 mg total) by mouth 2 (two) times daily with a meal. As needed for pain, Disp: 60 tablet, Rfl: 2 .  promethazine (PHENERGAN) 25 MG tablet, TAKE 1 TABLET (25 MG TOTAL) BY MOUTH EVERY 6 (SIX) HOURS AS NEEDED FOR NAUSEA, Disp: , Rfl:  .  SUMAtriptan (IMITREX) 100 MG tablet, TAKE 1 TABLET BY MOUTH ONCE AS NEEDED FOR MIGRAINE. MAY TAKE A SECOND DOSE AFTER 2 HOURS IF NEEDED, Disp: , Rfl:  .  tranexamic acid (LYSTEDA) 650 MG TABS tablet, TAKE 2 TABLETS (1,300 MG TOTAL) BY MOUTH 3 (THREE) TIMES DAILY. TAKE DURING MENSES FOR A MAXIMUM OF FIVE  DAYS, Disp: 30 tablet, Rfl: 2 .  tranexamic acid (LYSTEDA) 650 MG TABS tablet, Take 2 tablets (1,300 mg total) by mouth 3 (three) times daily. Take during menses for a maximum of five days, Disp: 30 tablet, Rfl: 2 .  TROKENDI XR 25 MG CP24, , Disp: , Rfl:   Allergies  Allergen Reactions  . Latex Dermatitis   Review of Systems Objective:   Vitals:   07/01/18 1614  Temp: (!) 96.2 F (35.7 C)    General: Well developed, nourished, in no acute distress, alert and oriented x3   Dermatological: Skin is warm, dry and supple bilateral. Nails x 10 are well maintained; remaining integument appears unremarkable at this time. There are no open sores, no preulcerative lesions, no rash or signs of infection present.  Vascular: Dorsalis Pedis artery and Posterior Tibial artery pedal pulses are 2/4 bilateral with immedate capillary fill time. Pedal hair growth present. No varicosities and no lower extremity edema present bilateral.   Neruologic: Grossly intact via light touch bilateral. Vibratory intact via tuning fork bilateral. Protective threshold with Semmes Wienstein monofilament intact to all pedal sites bilateral. Patellar and Achilles deep tendon reflexes 2+ bilateral. No Babinski or clonus noted bilateral.   Musculoskeletal: No gross boney pedal deformities bilateral. No  pain, crepitus, or limitation noted with foot and ankle range of motion bilateral. Muscular strength 5/5 in all groups tested bilateral.  She has pain on palpation medial calcaneal tubercle of the right heel.  Gait: Unassisted, Nonantalgic.    Radiographs:  Radiographs taken today demonstrate a cavus foot with soft tissue increase in density plantar fascial calcaneal nsertion site of the right heel.  No signs of infection.  Assessment & Plan:   Assessment: Plantar fasciitis right foot.  Plan: Discussed etiology pathology conservative versus surgical therapies.  At this point after sterile Betadine skin prep I injected  20 mg Kenalog 5 mg Marcaine point maximal tenderness medial aspect of the right heel.  Tolerated procedure well without complications.  Started her on a Medrol Dosepak to be followed by meloxicam.  Discussed appropriate shoe gear stretching exercise ice therapy shoe gear modifications.  Placed her in a plantar fascial brace to be followed by a night splint.  Plan to follow-up with her in 1 month.     Max T. [place] Heights[place], [place]PM

## 2018-08-05 ENCOUNTER — Encounter: Payer: Self-pay | Admitting: Podiatry

## 2018-08-05 ENCOUNTER — Ambulatory Visit: Payer: Managed Care, Other (non HMO) | Admitting: Podiatry

## 2018-08-05 ENCOUNTER — Other Ambulatory Visit: Payer: Self-pay

## 2018-08-05 VITALS — Temp 97.3°F

## 2018-08-05 DIAGNOSIS — M722 Plantar fascial fibromatosis: Secondary | ICD-10-CM | POA: Diagnosis not present

## 2018-08-05 NOTE — Progress Notes (Signed)
She presents today states that he was doing okay for a while and now is starting to hurt again.  Objective: Vital signs are stable she is alert and oriented x3.  Pulses are palpable.  There is no erythema edema cellulitis drainage odor that she does have pain on palpation medial Cokato tubercle of the right heel.  Assessment: Recurrence of plantar fasciitis right heel.  Plan: Reinjected the right heel today 20 mg Kenalog 5 mg Marcaine point maximal tenderness.  I encouraged her to purchase a new pair of shoes she states that she will get them soon I recommended that she continue all conservative therapies including plantar fascial brace night splint and oral medication.

## 2018-09-14 ENCOUNTER — Ambulatory Visit: Payer: Managed Care, Other (non HMO) | Admitting: Podiatry

## 2018-11-12 ENCOUNTER — Encounter: Payer: Managed Care, Other (non HMO) | Admitting: Obstetrics and Gynecology

## 2019-07-04 ENCOUNTER — Other Ambulatory Visit: Payer: Self-pay | Admitting: Podiatry

## 2019-12-23 DIAGNOSIS — G4733 Obstructive sleep apnea (adult) (pediatric): Secondary | ICD-10-CM | POA: Insufficient documentation

## 2020-11-06 ENCOUNTER — Other Ambulatory Visit: Payer: Self-pay | Admitting: Student

## 2020-11-06 DIAGNOSIS — R519 Headache, unspecified: Secondary | ICD-10-CM

## 2020-11-14 ENCOUNTER — Ambulatory Visit: Payer: Managed Care, Other (non HMO)

## 2020-11-21 ENCOUNTER — Other Ambulatory Visit: Payer: Self-pay

## 2020-11-21 ENCOUNTER — Ambulatory Visit
Admission: RE | Admit: 2020-11-21 | Discharge: 2020-11-21 | Disposition: A | Discharge status: Home / Self Care | Payer: Managed Care, Other (non HMO) | Source: Ambulatory Visit | Attending: Student | Admitting: Student

## 2020-11-21 DIAGNOSIS — R519 Headache, unspecified: Secondary | ICD-10-CM | POA: Diagnosis not present

## 2020-11-21 IMAGING — MR MR HEAD WO/W CM
13 series · 48 of 48 positions shown · IV contrast (gadavist)
Comparison: Head CT [DATE].

CLINICAL DATA: Headache disorder. Additional history provided by
scanning technologist: Patient reports headaches since childhood,
worsening recently, pain shooting up neck to head.

EXAM:
MRI HEAD WITHOUT AND WITH CONTRAST
TECHNIQUE: Multiplanar, multiecho pulse sequences of the brain and surrounding
structures were obtained without and with intravenous contrast.
CONTRAST:  10mL GADAVIST GADOBUTROL 1 MMOL/ML IV SOLN

[Series 5: ax dwi_tracew · axial · 3.0mm · 0.65mm/px · z∈[-95,+60]mm · 3 of 48 slices shown]
[im 1/48]
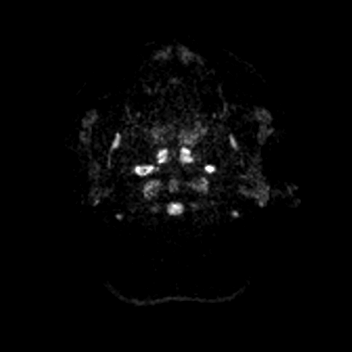
[im 24/48]
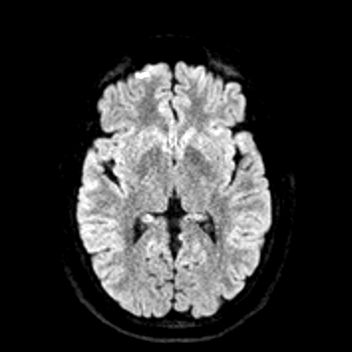
[im 48/48]
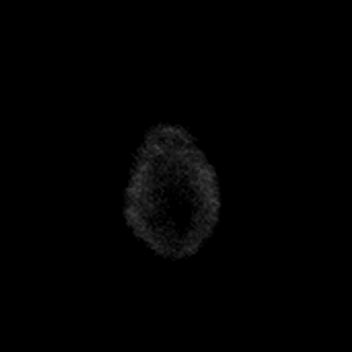

[Series 6: ax dwi_adc · axial · 3.0mm · 0.65mm/px · z∈[-95,+60]mm · 3 of 48 slices shown]
[im 1/48]
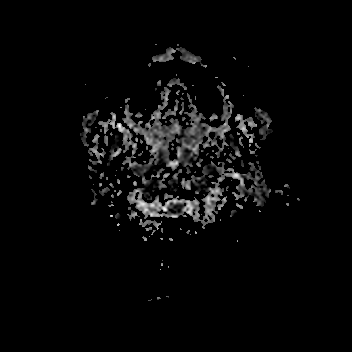
[im 24/48]
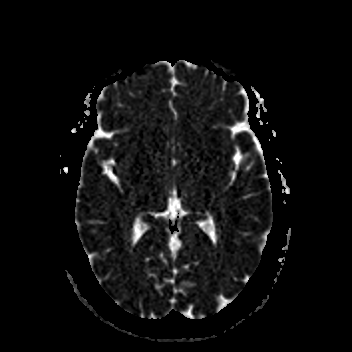
[im 48/48]
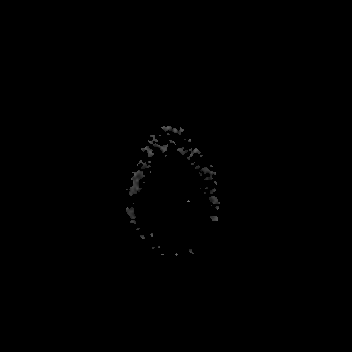

[Series 7: cor dwi_tracew · coronal · 5.0mm · 1.80mm/px · 2 of 38 slices shown]
[im 1/38]
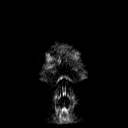
[im 38/38]
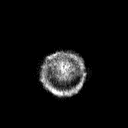

[Series 8: cor dwi_adc · coronal · 5.0mm · 1.80mm/px · 2 of 38 slices shown]
[im 1/38]
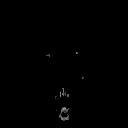
[im 38/38]
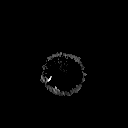

[Series 9: T1 · sagittal · 5.0mm · 0.62mm/px · 1 of 21 slices shown (1 of 2)]
[im 1/21]
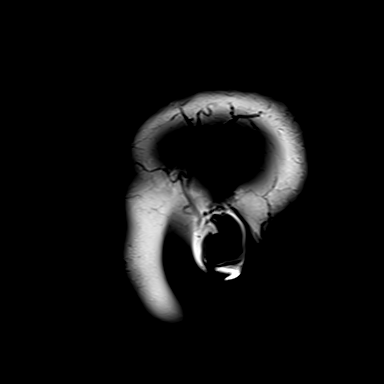

[Series 10: T2 · axial · 5.0mm · 0.53mm/px · z∈[-104,+63]mm · 2 of 29 slices shown]
[im 1/29]
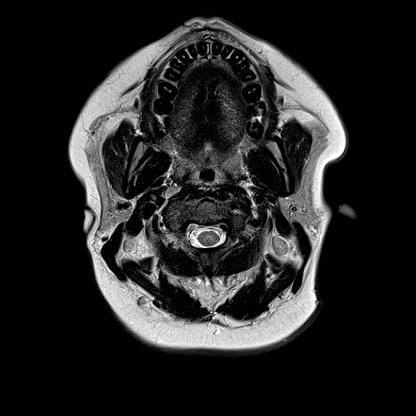
[im 29/29]
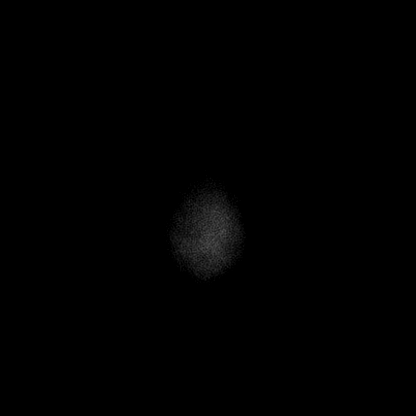

[Series 12: pha_images · axial · 3.0mm · 0.90mm/px · z∈[-109,+68]mm · 3 of 60 slices shown]
[im 1/60]
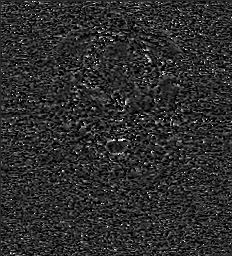
[im 30/60]
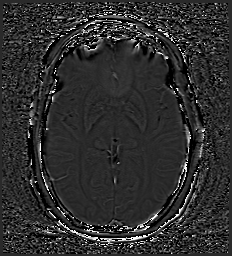
[im 60/60]
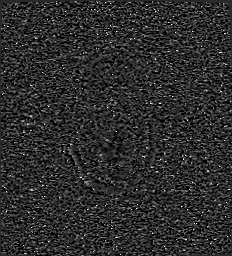

[Series 13: swi_images · axial · 3.0mm · 0.90mm/px · z∈[-109,+68]mm · 3 of 60 slices shown]
[im 1/60]
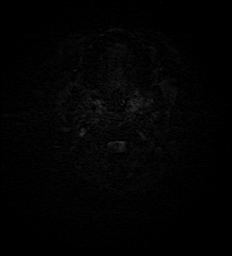
[im 30/60]
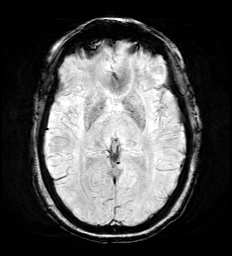
[im 60/60]
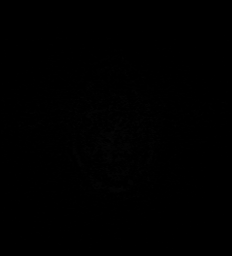

[Series 15: FLAIR · axial · 3.0mm · 0.53mm/px · z∈[-101,+60]mm · 3 of 55 slices shown]
[im 1/55]
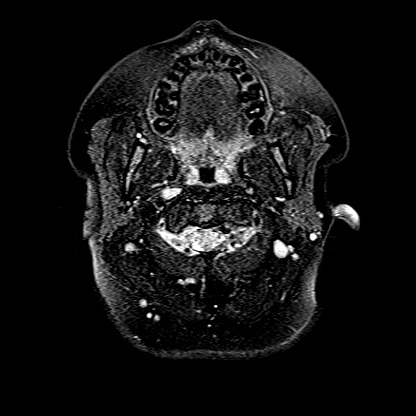
[im 28/55]
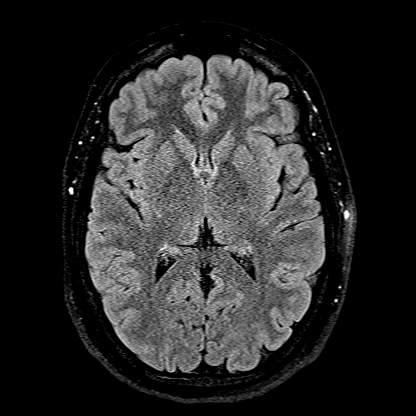
[im 55/55]
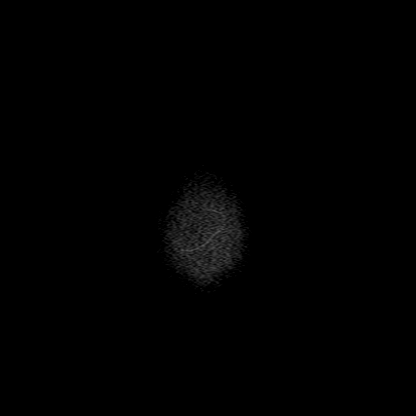

[Series 16: T1 · axial · 1.0mm · 0.98mm/px · z∈[-109,+82]mm · 11 of 190 slices shown (2 of 2)]
[im 1/190]
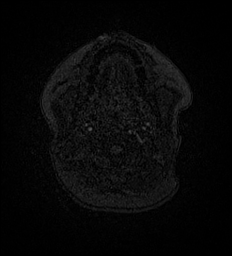
[im 19/190]
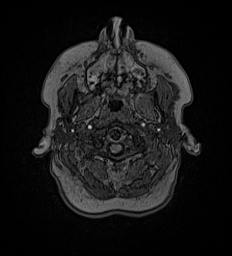
[im 38/190]
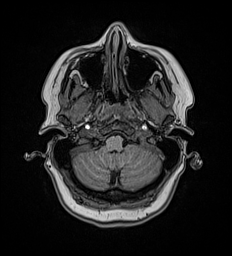
[im 57/190]
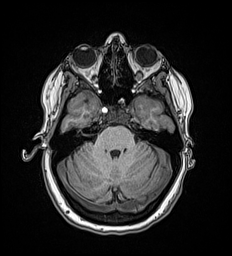
[im 76/190]
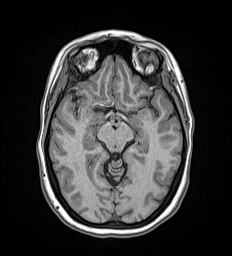
[im 95/190]
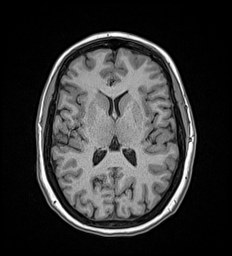
[im 114/190]
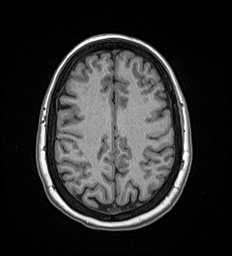
[im 133/190]
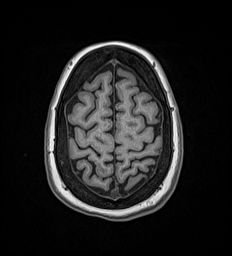
[im 152/190]
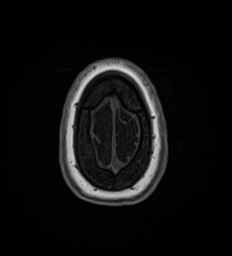
[im 171/190]
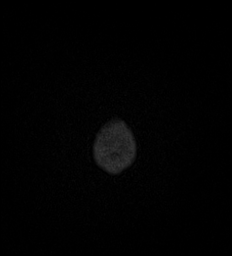
[im 190/190]
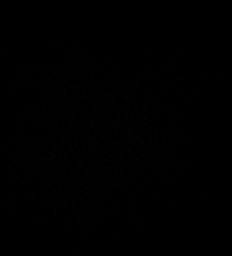

[Series 17: T2 post-contrast · coronal · 5.0mm · 0.57mm/px · 2 of 29 slices shown]
[im 1/29]
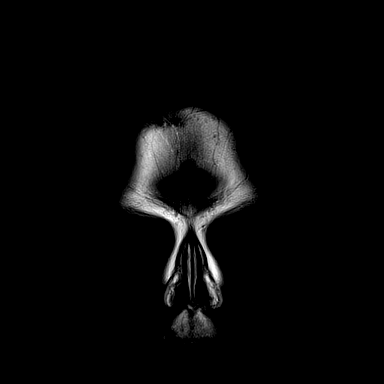
[im 29/29]
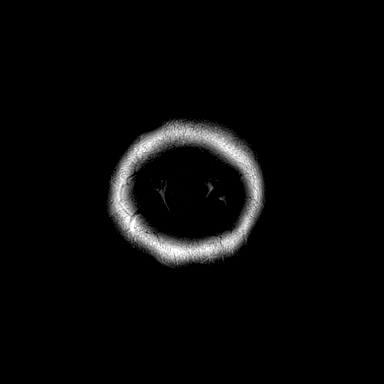

[Series 18: T1 post-contrast · axial · 1.0mm · 0.98mm/px · z∈[-109,+81]mm · 11 of 190 slices shown (1 of 2)]
[im 1/190]
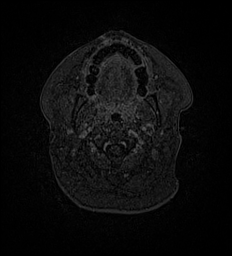
[im 19/190]
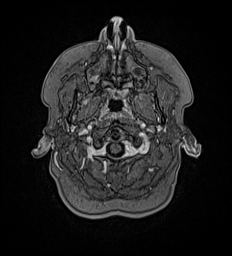
[im 38/190]
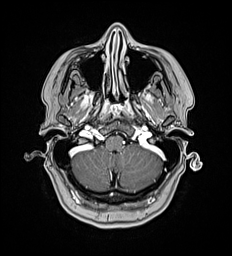
[im 57/190]
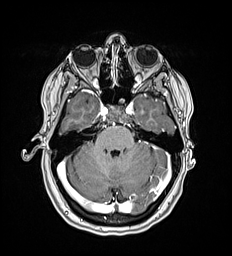
[im 76/190]
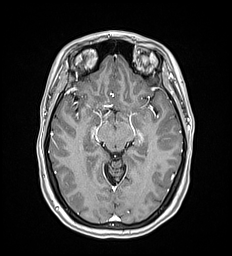
[im 95/190]
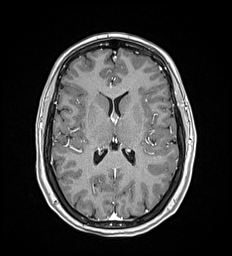
[im 114/190]
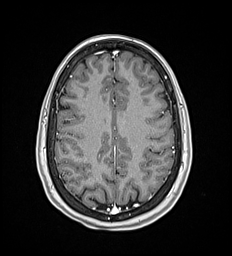
[im 133/190]
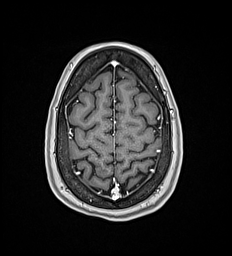
[im 152/190]
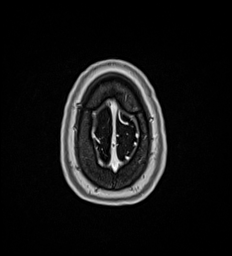
[im 171/190]
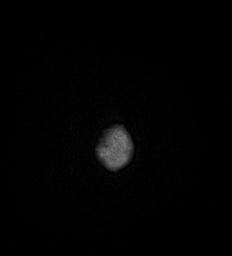
[im 190/190]
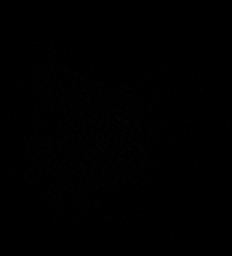

[Series 19: T1 post-contrast · coronal · 5.0mm · 0.57mm/px · 2 of 29 slices shown (2 of 2)]
[im 1/29]
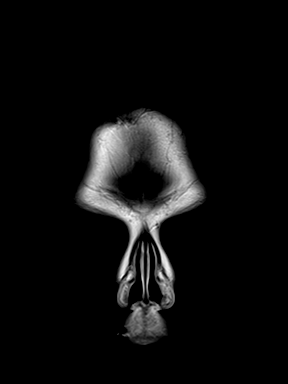
[im 29/29]
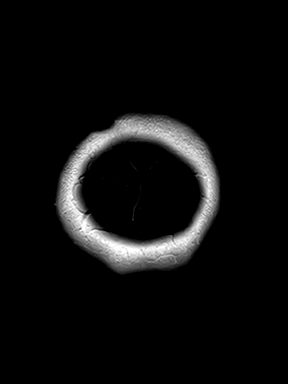

[48 of 48 positions shown; findings below may reference images not displayed]

FINDINGS: Brain:

Cerebral volume is normal.

The cerebellar tonsils are slightly low lying (left greater than
right) with the left cerebellar tonsil extending 3 mm below level of
foramen magnum (series 9, image 11). This does not meet measurement
criteria for a Chiari 1 malformation.

No cortical encephalomalacia is identified. No significant cerebral
white matter disease.

There is no acute infarct.

No evidence of an intracranial mass.

No chronic intracranial blood products.

No extra-axial fluid collection.

No midline shift.

No pathologic intracranial enhancement identified.

Vascular: Maintained flow voids within the proximal large arterial
vessels. Left frontal lobe developmental venous anomaly (incidental
anatomic variant).

Skull and upper cervical spine: Abnormal T1 hypointense marrow
signal within the calvarium and visualized upper cervical spine.

Sinuses/Orbits: Visualized orbits show no acute finding. 1.7 cm left
maxillary sinus mucous retention cyst.
IMPRESSION: No evidence of acute intracranial abnormality.

Slightly low-lying cerebellar tonsils (left greater than right), as
described.

Otherwise unremarkable MRI appearance of the brain.

1.7 cm left maxillary sinus mucous retention cyst.

Abnormal T1 hypointense marrow signal within the calvarium and
visualized upper cervical spine. While these findings can reflect a
marrow infiltrative process, the most common causes include chronic
anemia, smoking and obesity.

## 2020-11-21 MED ORDER — GADOBUTROL 1 MMOL/ML IV SOLN
10.0000 mL | Freq: Once | INTRAVENOUS | Status: AC | PRN
  Administered 2020-11-21: 10 mL via INTRAVENOUS

## 2020-12-27 ENCOUNTER — Ambulatory Visit
Admission: RE | Admit: 2020-12-27 | Discharge: 2020-12-27 | Disposition: A | Discharge status: Home / Self Care | Payer: Managed Care, Other (non HMO) | Source: Ambulatory Visit | Attending: Internal Medicine | Admitting: Internal Medicine

## 2020-12-27 ENCOUNTER — Other Ambulatory Visit: Payer: Self-pay | Admitting: Internal Medicine

## 2020-12-27 ENCOUNTER — Other Ambulatory Visit: Payer: Self-pay

## 2020-12-27 DIAGNOSIS — R600 Localized edema: Secondary | ICD-10-CM | POA: Diagnosis present

## 2020-12-27 IMAGING — US US EXTREM LOW VENOUS*R*
1 series · 13 of 24 positions shown · non-contrast
Comparison: None.

CLINICAL DATA: Right lower extremity edema.



[Series 1: us venous img lower uni right (dvt) · portal-venous · 13 of 42 slices shown]
[im 1/42]
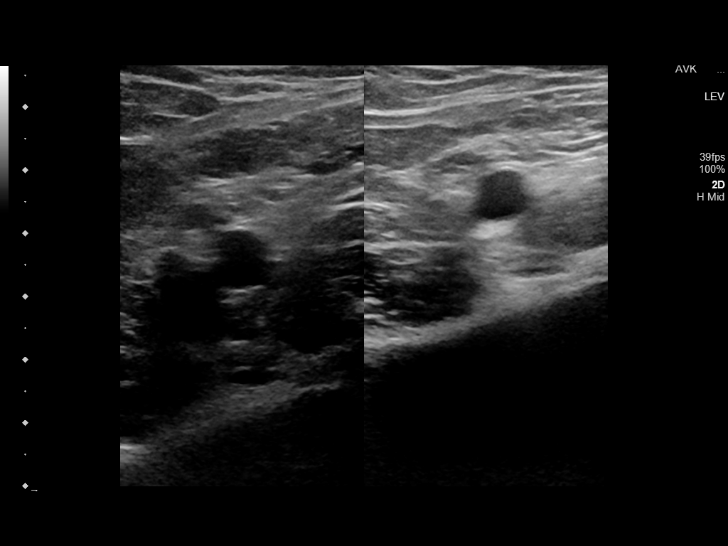
[im 4/42]
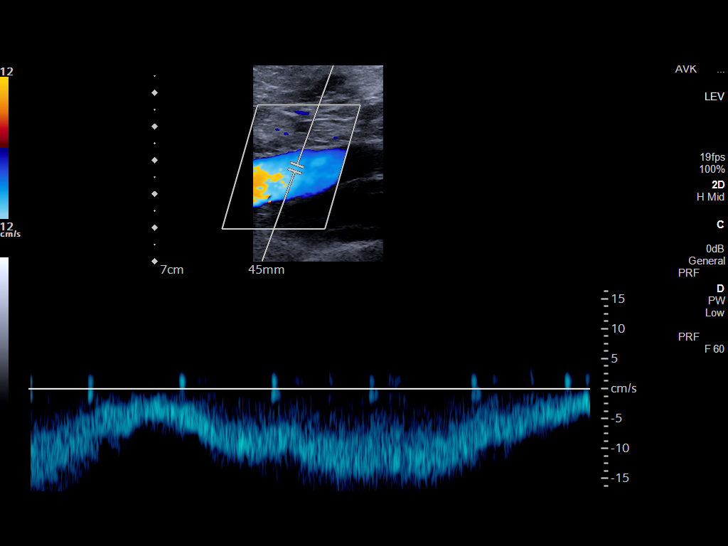
[im 8/42]
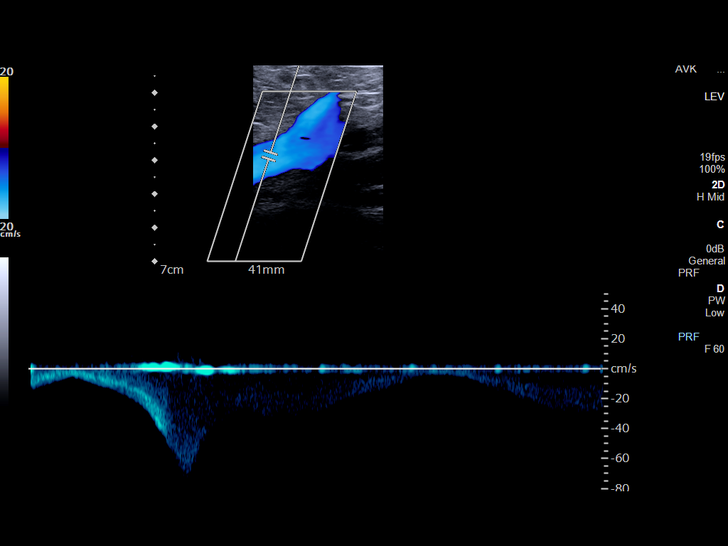
[im 11/42]
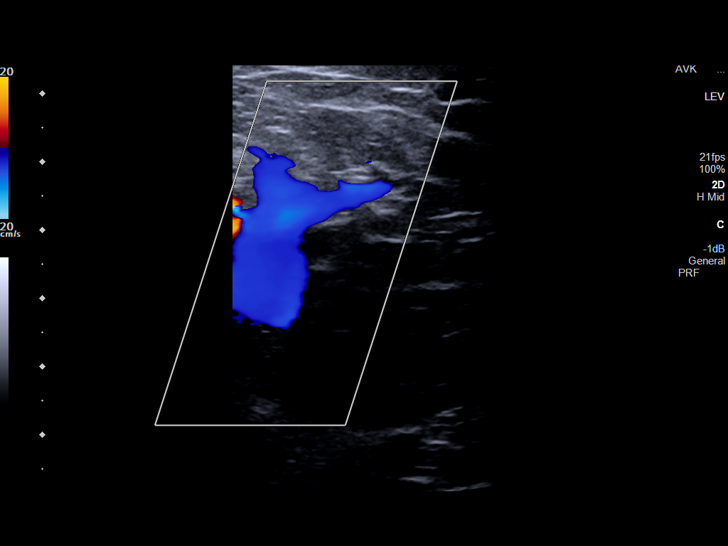
[im 15/42]
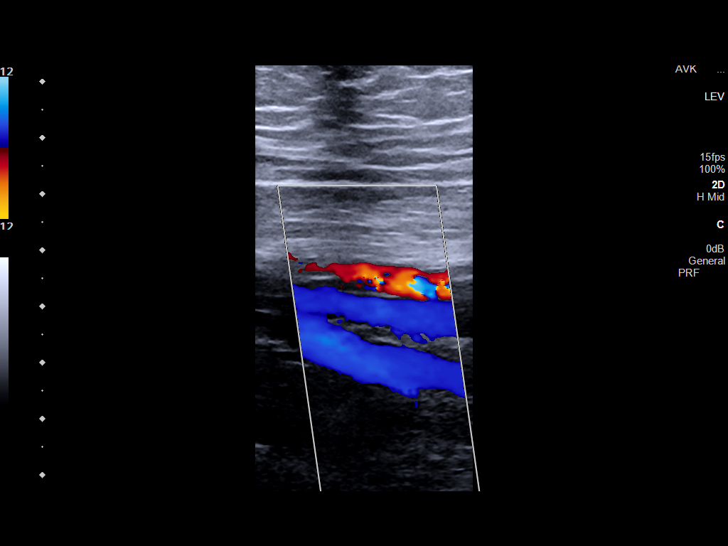
[im 18/42]
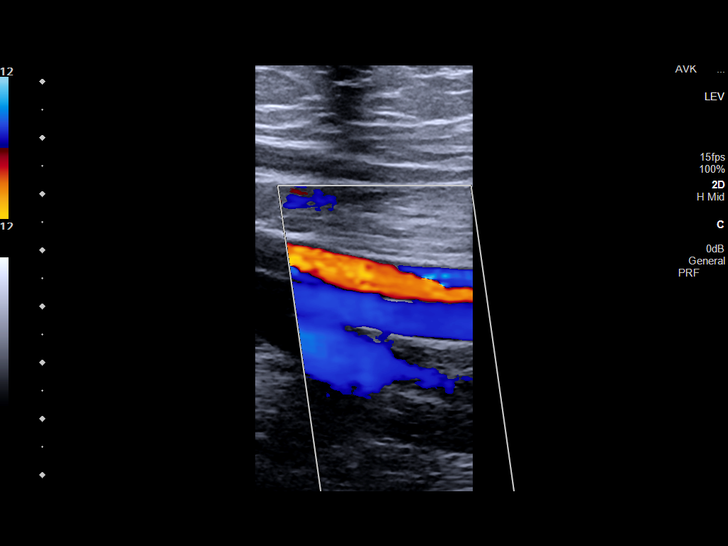
[im 22/42]
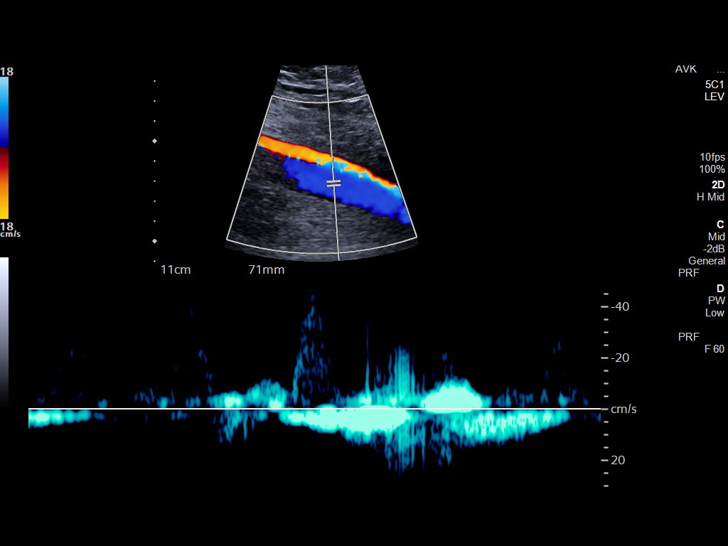
[im 24/42]
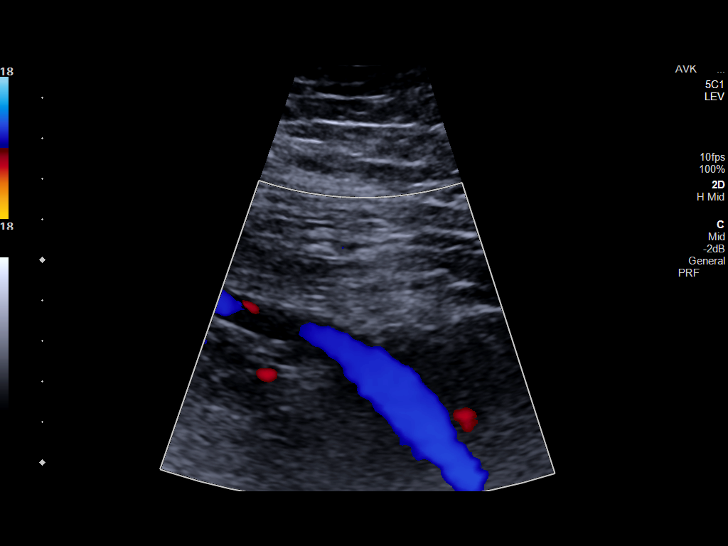
[im 27/42]
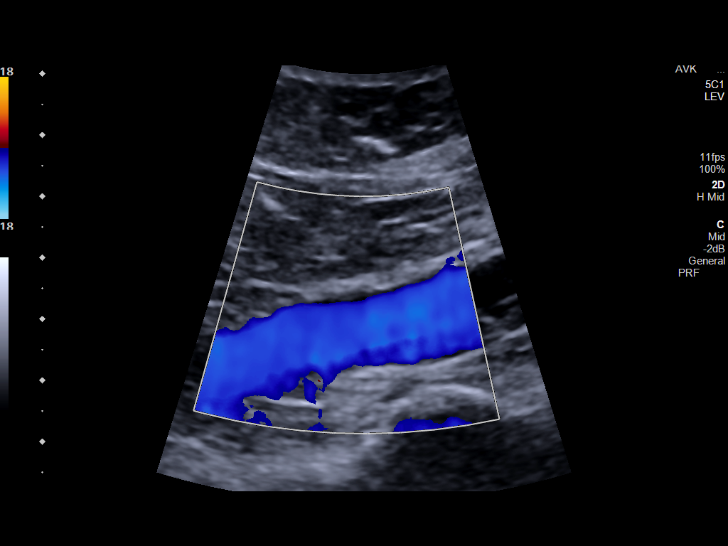
[im 31/42]
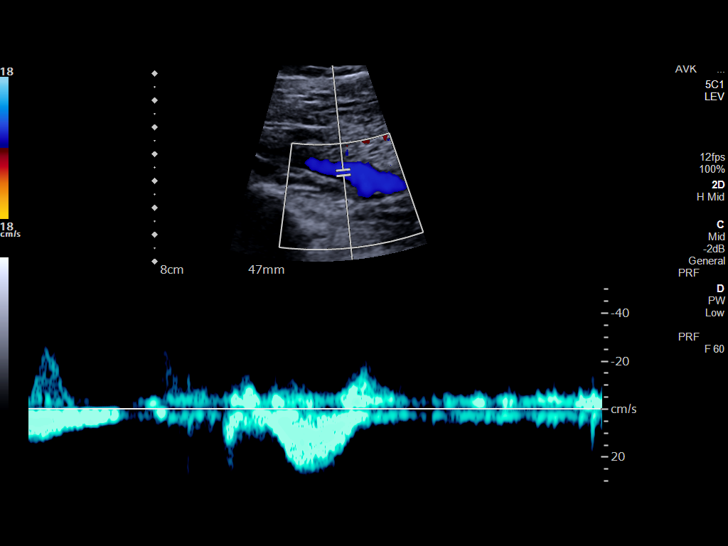
[im 34/42]
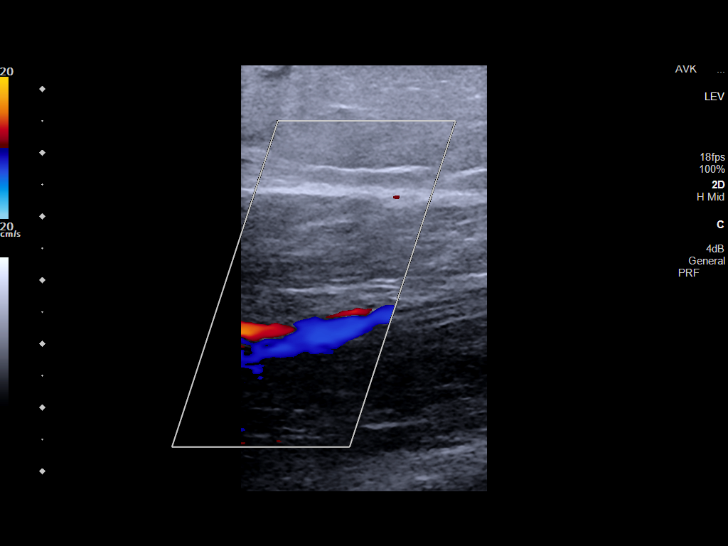
[im 38/42]
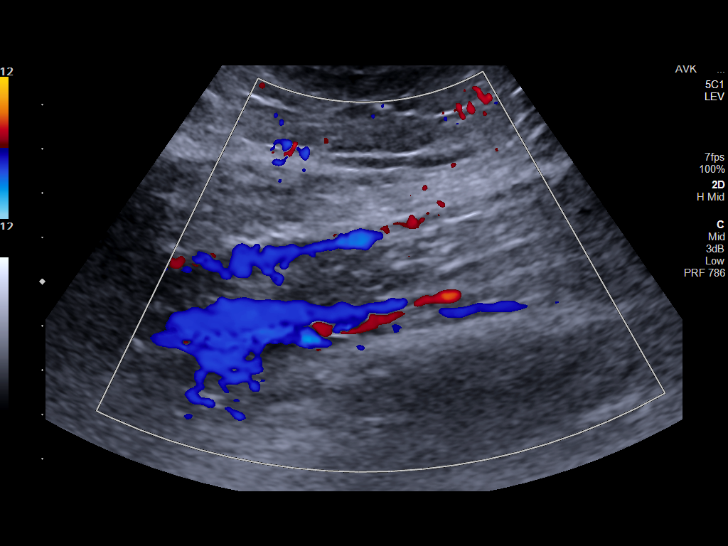
[im 42/42]
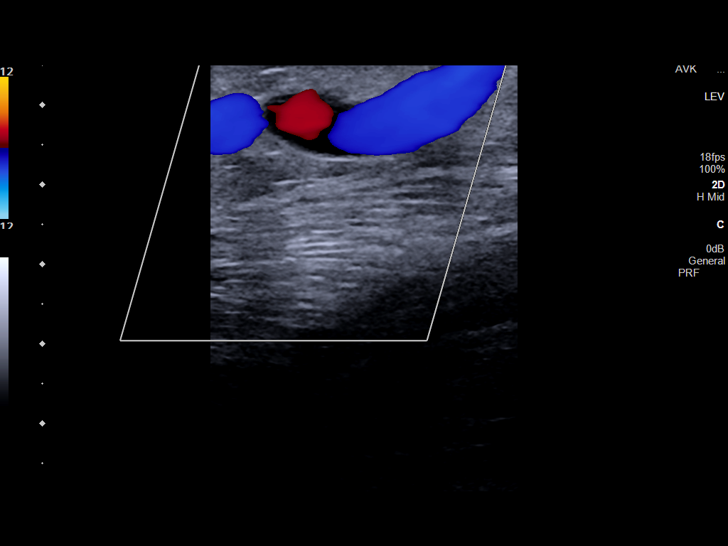

[13 of 24 positions shown; findings below may reference images not displayed]

FINDINGS: Contralateral Common Femoral Vein: Respiratory phasicity is normal
and symmetric with the symptomatic side. No evidence of thrombus.
Normal compressibility.

Common Femoral Vein: No evidence of thrombus. Normal
compressibility, respiratory phasicity and response to augmentation.

Saphenofemoral Junction: No evidence of thrombus. Normal
compressibility and flow on color Doppler imaging.

Profunda Femoral Vein: No evidence of thrombus. Normal
compressibility and flow on color Doppler imaging.

Femoral Vein: No evidence of thrombus. Normal compressibility,
respiratory phasicity and response to augmentation.

Popliteal Vein: No evidence of thrombus. Normal compressibility,
respiratory phasicity and response to augmentation.

Calf Veins: No evidence of thrombus. Normal compressibility and flow
on color Doppler imaging.

Superficial Great Saphenous Vein: No evidence of thrombus. Normal
compressibility.

Venous Reflux:  None.

Other Findings: No evidence of superficial thrombophlebitis or
abnormal fluid collection. There are some visualized varicosities in
the right calf which are patent and do not demonstrate thrombosis.
IMPRESSION: 1. No evidence of right lower extremity deep venous thrombosis.
2. Patent superficial varicosities visualized in the right calf.

## 2020-12-29 ENCOUNTER — Ambulatory Visit: Payer: Managed Care, Other (non HMO)

## 2021-04-23 DIAGNOSIS — E559 Vitamin D deficiency, unspecified: Secondary | ICD-10-CM | POA: Diagnosis not present

## 2021-04-23 DIAGNOSIS — H93A1 Pulsatile tinnitus, right ear: Secondary | ICD-10-CM | POA: Diagnosis not present

## 2021-04-23 DIAGNOSIS — M542 Cervicalgia: Secondary | ICD-10-CM | POA: Diagnosis not present

## 2021-04-23 DIAGNOSIS — M5481 Occipital neuralgia: Secondary | ICD-10-CM | POA: Diagnosis not present

## 2021-04-25 ENCOUNTER — Ambulatory Visit: Payer: 59 | Admitting: Family Medicine

## 2021-04-25 ENCOUNTER — Other Ambulatory Visit (HOSPITAL_COMMUNITY)
Admission: RE | Admit: 2021-04-25 | Discharge: 2021-04-25 | Disposition: A | Discharge status: Home / Self Care | Payer: 59 | Source: Ambulatory Visit | Attending: Family Medicine | Admitting: Family Medicine

## 2021-04-25 ENCOUNTER — Other Ambulatory Visit: Payer: Self-pay

## 2021-04-25 ENCOUNTER — Encounter: Payer: Self-pay | Admitting: Family Medicine

## 2021-04-25 VITALS — Ht 64.0 in | Wt 254.6 lb

## 2021-04-25 DIAGNOSIS — N920 Excessive and frequent menstruation with regular cycle: Secondary | ICD-10-CM | POA: Insufficient documentation

## 2021-04-25 DIAGNOSIS — Z124 Encounter for screening for malignant neoplasm of cervix: Secondary | ICD-10-CM | POA: Insufficient documentation

## 2021-04-25 DIAGNOSIS — Z01419 Encounter for gynecological examination (general) (routine) without abnormal findings: Secondary | ICD-10-CM | POA: Diagnosis not present

## 2021-04-25 MED ORDER — TRANEXAMIC ACID 650 MG PO TABS: 1300.0000 mg | ORAL_TABLET | Freq: Three times a day (TID) | ORAL | 2 refills | Status: DC

## 2021-04-25 NOTE — Patient Instructions (Signed)
Preventive Care 61-33 Years Old, Female ?Preventive care refers to lifestyle choices and visits with your health care provider that can promote health and wellness. Preventive care visits are also called wellness exams. ?What can I expect for my preventive care visit? ?Counseling ?During your preventive care visit, your health care provider may ask about your: ?Medical history, including: ?Past medical problems. ?Family medical history. ?Pregnancy history. ?Current health, including: ?Menstrual cycle. ?Method of birth control. ?Emotional well-being. ?Home life and relationship well-being. ?Sexual activity and sexual health. ?Lifestyle, including: ?Alcohol, nicotine or tobacco, and drug use. ?Access to firearms. ?Diet, exercise, and sleep habits. ?Work and work Statistician. ?Sunscreen use. ?Safety issues such as seatbelt and bike helmet use. ?Physical exam ?Your health care provider may check your: ?Height and weight. These may be used to calculate your BMI (body mass index). BMI is a measurement that tells if you are at a healthy weight. ?Waist circumference. This measures the distance around your waistline. This measurement also tells if you are at a healthy weight and may help predict your risk of certain diseases, such as type 2 diabetes and high blood pressure. ?Heart rate and blood pressure. ?Body temperature. ?Skin for abnormal spots. ?What immunizations do I need? ?Vaccines are usually given at various ages, according to a schedule. Your health care provider will recommend vaccines for you based on your age, medical history, and lifestyle or other factors, such as travel or where you work. ?What tests do I need? ?Screening ?Your health care provider may recommend screening tests for certain conditions. This may include: ?Pelvic exam and Pap test. ?Lipid and cholesterol levels. ?Diabetes screening. This is done by checking your blood sugar (glucose) after you have not eaten for a while (fasting). ?Hepatitis B  test. ?Hepatitis C test. ?HIV (human immunodeficiency virus) test. ?STI (sexually transmitted infection) testing, if you are at risk. ?BRCA-related cancer screening. This may be done if you have a family history of breast, ovarian, tubal, or peritoneal cancers. ?Talk with your health care provider about your test results, treatment options, and if necessary, the need for more tests. ?Follow these instructions at home: ?Eating and drinking ? ?Eat a healthy diet that includes fresh fruits and vegetables, whole grains, lean protein, and low-fat dairy products. ?Take vitamin and mineral supplements as recommended by your health care provider. ?Do not drink alcohol if: ?Your health care provider tells you not to drink. ?You are pregnant, may be pregnant, or are planning to become pregnant. ?If you drink alcohol: ?Limit how much you have to 0-1 drink a day. ?Know how much alcohol is in your drink. In the [place] one drink equals one 12 oz bottle of beer (355 mL), one 5 oz glass of wine (148 mL), or one 1? oz glass of hard liquor (44 mL). ?Lifestyle ?Brush your teeth every morning and night with fluoride toothpaste. Floss one time each day. ?Exercise for at least 30 minutes 5 or more days each week. ?Do not use any products that contain nicotine or tobacco. These products include cigarettes, chewing tobacco, and vaping devices, such as e-cigarettes. If you need help quitting, ask your health care provider. ?Do not use drugs. ?If you are sexually active, practice safe sex. Use a condom or other form of protection to prevent STIs. ?If you do not wish to become pregnant, use a form of birth control. If you plan to become pregnant, see your health care provider for a prepregnancy visit. ?Find healthy ways to manage stress, such as: ?Meditation, yoga,  or listening to music. ?Journaling. ?Talking to a trusted person. ?Spending time with friends and family. ?Minimize exposure to UV radiation to reduce your risk of skin  cancer. ?Safety ?Always wear your seat belt while driving or riding in a vehicle. ?Do not drive: ?If you have been drinking alcohol. Do not ride with someone who has been drinking. ?If you have been using any mind-altering substances or drugs. ?While texting. ?When you are tired or distracted. ?Wear a helmet and other protective equipment during sports activities. ?If you have firearms in your house, make sure you follow all gun safety procedures. ?Seek help if you have been physically or sexually abused. ?What's next? ?Go to your health care provider once a year for an annual wellness visit. ?Ask your health care provider how often you should have your eyes and teeth checked. ?Stay up to date on all vaccines. ?This information is not intended to replace advice given to you by your health care provider. Make sure you discuss any questions you have with your health care provider. ?Document Revised: 07/26/2020 Document Reviewed: 07/26/2020 ?Elsevier Patient Education ? [place]. ? ?

## 2021-04-25 NOTE — Assessment & Plan Note (Addendum)
[id number] - Lysteda 1st few days of each cycle. ?

## 2021-04-25 NOTE — Progress Notes (Signed)
Subjective:  ?  ? Alicia Howard is a 31 y.o. female and is here for a comprehensive physical exam. The patient reports problems - heavy menses at the start of cycles. Previously on Lysteda. ? ? ? ? ?The following portions of the patient's history were reviewed and updated as appropriate: allergies, current medications, past family history, past medical history, past social history, past surgical history, and problem list. ? ?Review of Systems ?Pertinent items noted in HPI and remainder of comprehensive ROS otherwise negative.  ? ?Objective:  ? ? Ht 5\' 4"  (1.626 m)   Wt 254 lb 9.6 oz (115.5 kg)   LMP 04/01/2021 (Exact Date)   BMI 43.70 kg/m?  ?General appearance: alert, cooperative, and appears stated age ?Head: Normocephalic, without obvious abnormality, atraumatic ?Neck: no adenopathy, supple, symmetrical, trachea midline, and thyroid not enlarged, symmetric, no tenderness/mass/nodules ?Lungs: clear to auscultation bilaterally ?Breasts: normal appearance, no masses or tenderness ?Heart: regular rate and rhythm, S1, S2 normal, no murmur, click, rub or gallop ?Abdomen: soft, non-tender; bowel sounds normal; no masses,  no organomegaly ?Pelvic: cervix normal in appearance, external genitalia normal, no adnexal masses or tenderness, no cervical motion tenderness, uterus normal size, shape, and consistency, and vagina normal without discharge ?Extremities: extremities normal, atraumatic, no cyanosis or edema ?Pulses: 2+ and symmetric ?Skin: Skin color, texture, turgor normal. No rashes or lesions ?Lymph nodes: Cervical, supraclavicular, and axillary nodes normal. ?Neurologic: Grossly normal  ?  ?Assessment:  ? ? Healthy female exam.    ?  ?Plan:  ? ?Problem List Items Addressed This Visit   ? ?  ? Unprioritized  ? Menorrhagia with regular cycle  ?  04/03/2021 - Lysteda 1st few days of each cycle. ?  ?  ? Relevant Medications  ? tranexamic acid (LYSTEDA) 650 MG TABS tablet  ? ?Other Visit Diagnoses   ? ? Screening for  malignant neoplasm of cervix    -  Primary  ? 76720  ? Relevant Orders  ? Cytology - PAP  ? Encounter for gynecological examination without abnormal finding      ? 94709  ? ?  ? ?Return in 1 year (on 04/26/2022). ? ?  ?See After Visit Summary for Counseling Recommendations  ? ?

## 2021-04-26 ENCOUNTER — Other Ambulatory Visit: Payer: Self-pay | Admitting: Student

## 2021-04-27 ENCOUNTER — Other Ambulatory Visit: Payer: Self-pay | Admitting: Student

## 2021-04-27 DIAGNOSIS — M5481 Occipital neuralgia: Secondary | ICD-10-CM

## 2021-04-27 DIAGNOSIS — Q048 Other specified congenital malformations of brain: Secondary | ICD-10-CM

## 2021-04-27 DIAGNOSIS — H93A1 Pulsatile tinnitus, right ear: Secondary | ICD-10-CM

## 2021-04-27 DIAGNOSIS — R292 Abnormal reflex: Secondary | ICD-10-CM

## 2021-04-30 LAB — CYTOLOGY - PAP
Comment: NEGATIVE
Diagnosis: NEGATIVE
High risk HPV: NEGATIVE

## 2021-05-29 ENCOUNTER — Ambulatory Visit
Admission: RE | Admit: 2021-05-29 | Discharge: 2021-05-29 | Disposition: A | Discharge status: Home / Self Care | Payer: 59 | Source: Ambulatory Visit | Attending: Student | Admitting: Student

## 2021-05-29 DIAGNOSIS — R292 Abnormal reflex: Secondary | ICD-10-CM

## 2021-05-29 DIAGNOSIS — R42 Dizziness and giddiness: Secondary | ICD-10-CM | POA: Diagnosis not present

## 2021-05-29 DIAGNOSIS — H93A1 Pulsatile tinnitus, right ear: Secondary | ICD-10-CM

## 2021-05-29 DIAGNOSIS — M5481 Occipital neuralgia: Secondary | ICD-10-CM

## 2021-05-29 DIAGNOSIS — Q048 Other specified congenital malformations of brain: Secondary | ICD-10-CM

## 2021-05-29 DIAGNOSIS — M47812 Spondylosis without myelopathy or radiculopathy, cervical region: Secondary | ICD-10-CM | POA: Diagnosis not present

## 2021-05-29 IMAGING — MR MR CERVICAL SPINE WO/W CM
6 of 8 series · 29 of 48 positions shown · IV contrast (multihance)
Comparison: None.

CLINICAL DATA: Headache and neck pain

EXAM:
MRI CERVICAL SPINE WITHOUT AND WITH CONTRAST
TECHNIQUE: Multiplanar and multiecho pulse sequences of the cervical spine, to
include the craniocervical junction and cervicothoracic junction,
were obtained without and with intravenous contrast.
CONTRAST:  20mL MULTIHANCE GADOBENATE DIMEGLUMINE 529 MG/ML IV SOLN

[Series 6: T1 · sagittal · 3.0mm · 0.66mm/px · 4 of 15 slices shown (1 of 3)]
[im 1/15]
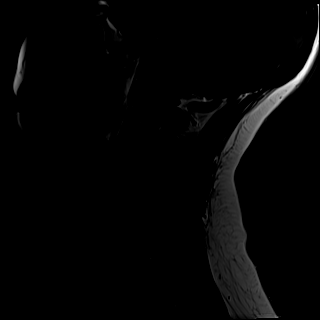
[im 5/15]
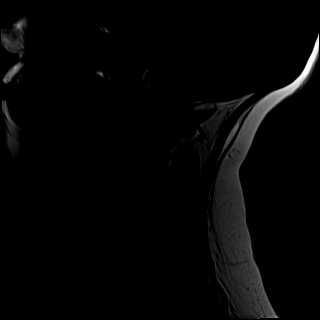
[im 10/15]
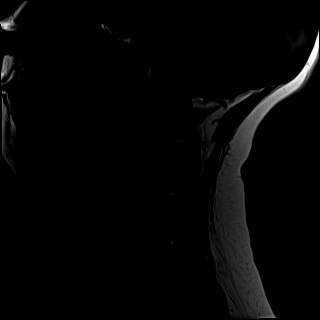
[im 15/15]
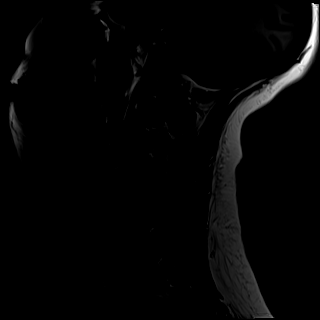

[Series 9: T2 · axial · 3.0mm · 0.50mm/px · z∈[-63,+44]mm · 8 of 35 slices shown (1 of 2)]
[im 1/35]
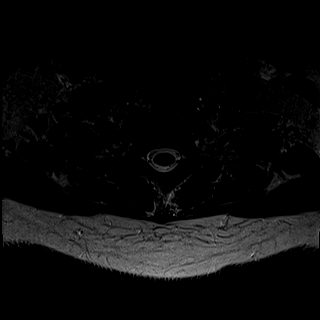
[im 5/35]
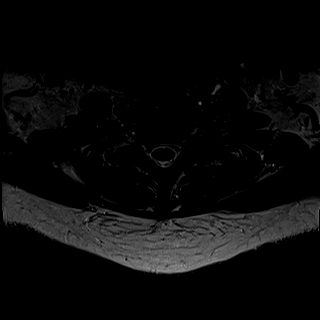
[im 10/35]
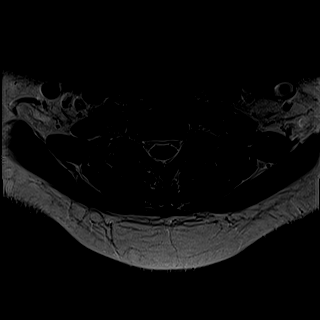
[im 15/35]
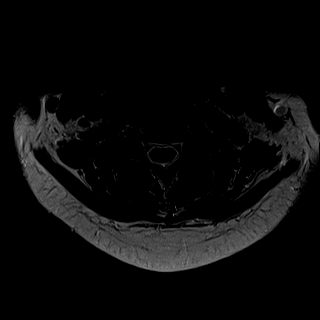
[im 20/35]
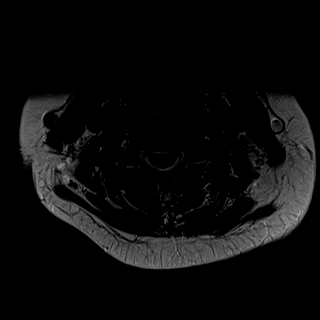
[im 25/35]
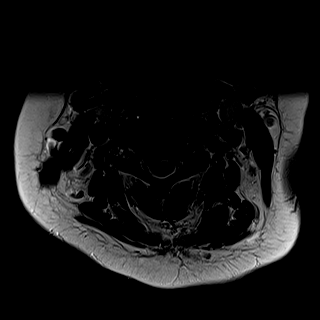
[im 30/35]
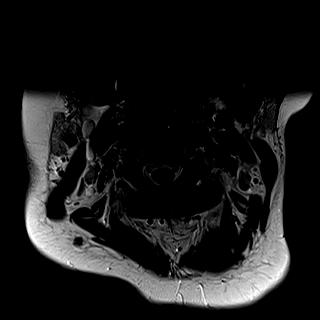
[im 35/35]
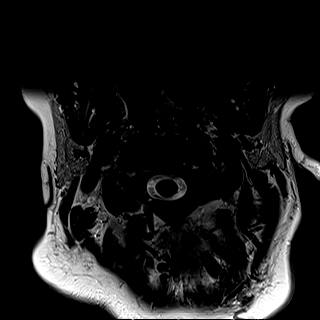

[Series 11: T1 · axial · non-contrast · 3.0mm · 0.31mm/px · z∈[-63,+44]mm · 8 of 35 slices shown (2 of 3)]
[im 1/35]
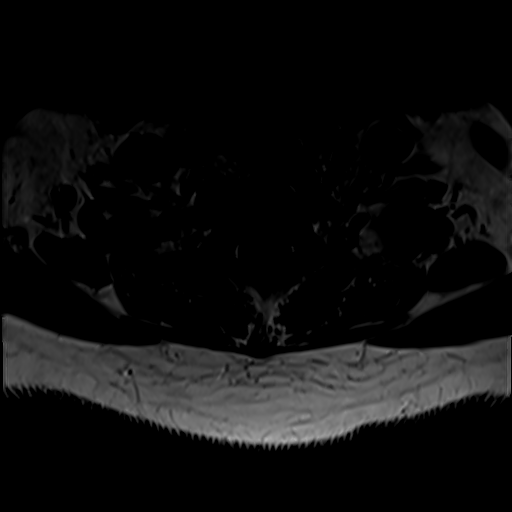
[im 5/35]
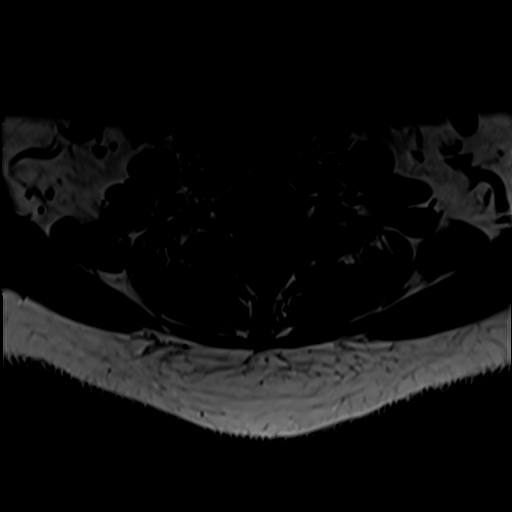
[im 10/35]
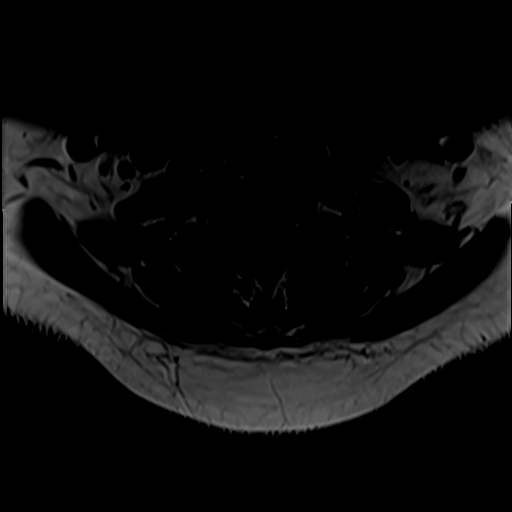
[im 15/35]
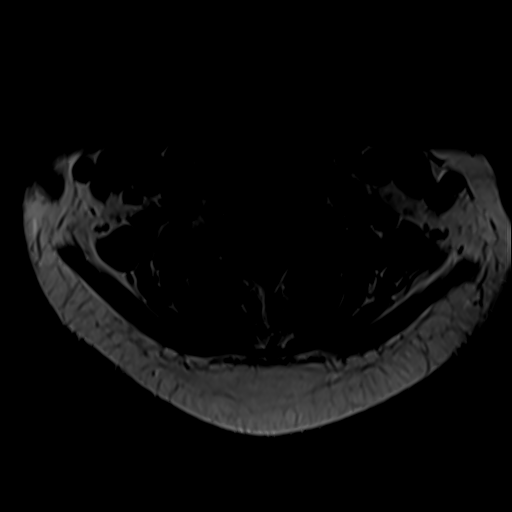
[im 20/35]
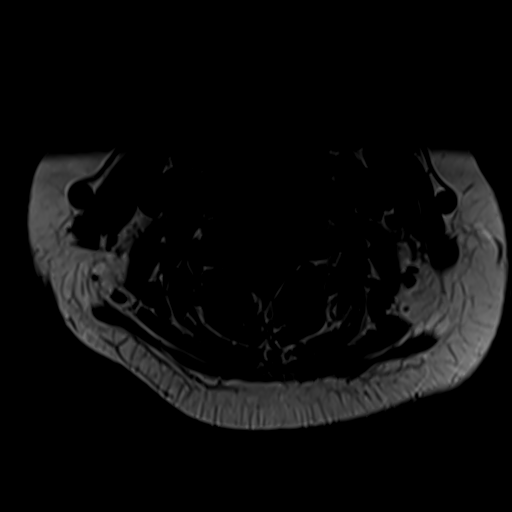
[im 25/35]
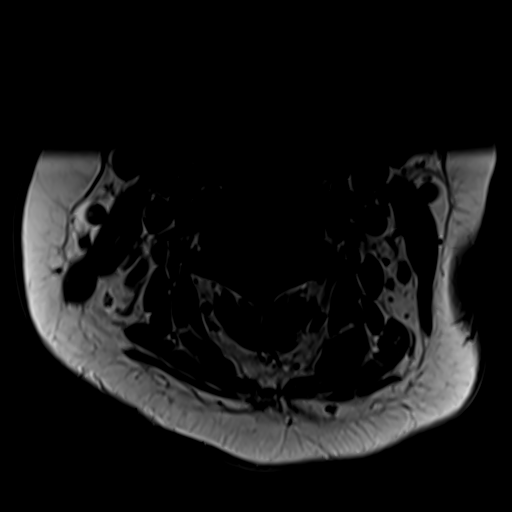
[im 30/35]
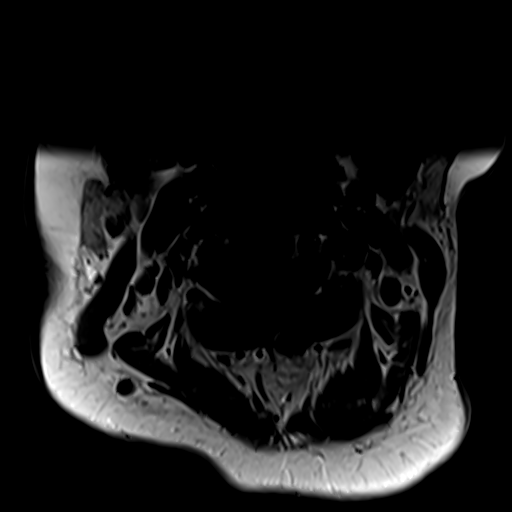
[im 35/35]
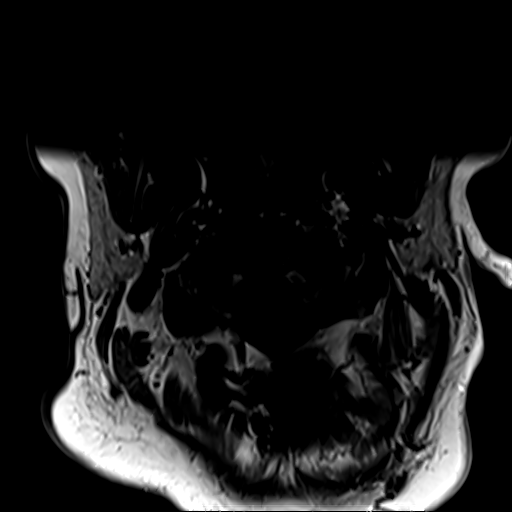

[Series 12: T2 · sagittal · 3.0mm · 0.55mm/px · 4 of 15 slices shown (2 of 2)]
[im 1/15]
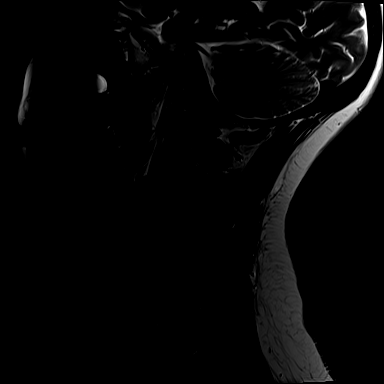
[im 5/15]
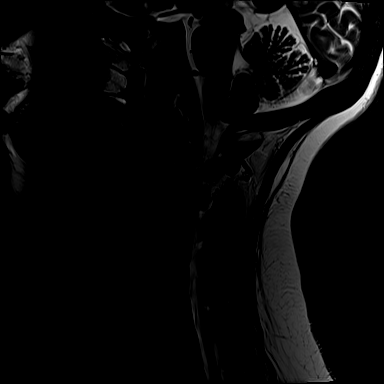
[im 10/15]
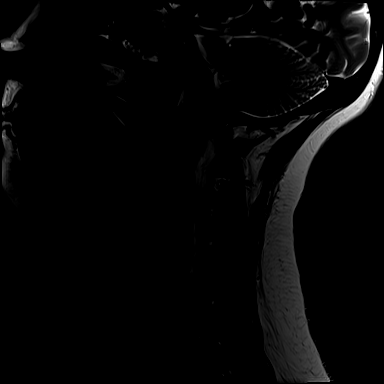
[im 15/15]
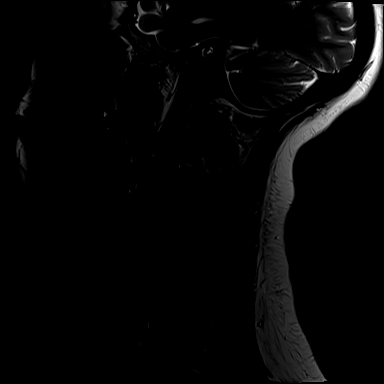

[Series 13: T1 fat-sat post-contrast · sagittal · 3.0mm · 0.66mm/px · 4 of 15 slices shown]
[im 1/15]
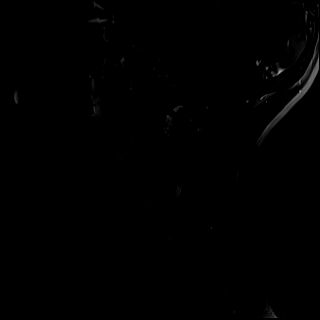
[im 5/15]
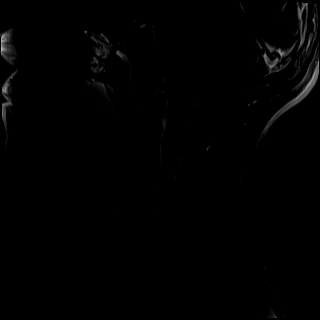
[im 10/15]
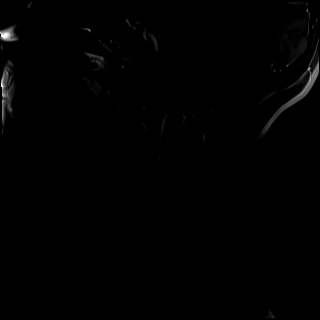
[im 15/15]
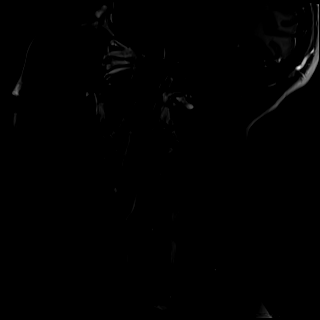

[Series 14: T1 · axial · 3.0mm · 0.31mm/px · 1 of 35 slices shown (3 of 3)]
[im 1/35]
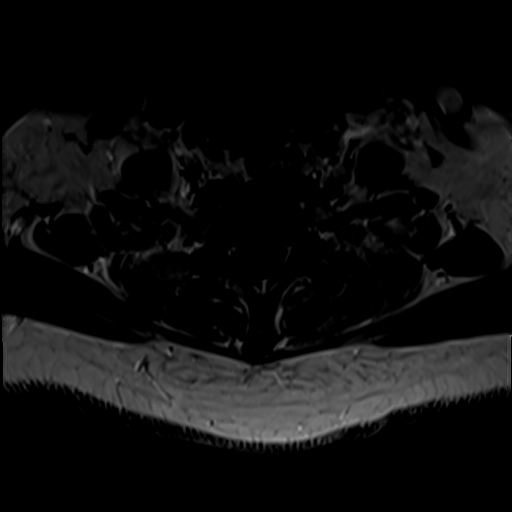

[29 of 48 positions shown; findings below may reference images not displayed]

FINDINGS: Alignment: Mild straightening of the normal cervical lordosis. No
listhesis.

Vertebrae: No acute fracture or suspicious osseous lesion. Diffusely
decreased marrow signal. No abnormal enhancement. Congenitally short
pedicles, which narrow the AP diameter of the spinal canal.

Cord: Normal signal and morphology.  No abnormal enhancement.

Posterior Fossa, vertebral arteries, paraspinal tissues: Low lying
cerebellar tonsils, left-greater-than-right, which extend up to 3 mm
past the foramen magnum. Otherwise negative.

Disc levels:

C2-C3: No significant disc bulge. No spinal canal stenosis or
neuroforaminal narrowing.

C3-C4: Small left subarticular disc protrusion. No spinal canal
stenosis. Mild left neural foraminal narrowing.

C4-C5: Minimal disc bulge. Mild left uncovertebral hypertrophy. No
spinal canal stenosis. Mild left neural foraminal narrowing.

C5-C6: No significant disc bulge. No spinal canal stenosis or
neuroforaminal narrowing.

C6-C7: No significant disc bulge. No spinal canal stenosis or
neuroforaminal narrowing.

C7-T1: No significant disc bulge. No spinal canal stenosis or
neuroforaminal narrowing.
IMPRESSION: 1. Mild degenerative changes in the cervical spine, with mild left
neural foraminal narrowing at C3-C4 and C4-C5. No spinal canal
stenosis.

2. Diffusely decreased marrow signal, which is nonspecific; although
this can be caused by an infiltrative marrow process, the most
common causes include anemia, obesity, or tobacco use.

## 2021-05-29 IMAGING — MR MR MRV HEAD W/O CM
2 series · 48 of 48 positions shown · non-contrast
Comparison: Cervical spine MRI the same day reported separately.
Prior brain MRI [DATE].

CLINICAL DATA: 30-year-old female with headache disorder. Frontal
and posterior headaches that wraps around. Posterior neck pain.
Dizziness when bending over and standing up. Pulsatile right ear
tinnitus. Symptoms for almost 1 year.

EXAM:
MR VENOGRAM HEAD WITHOUT CONTRAST
TECHNIQUE: Angiographic images of the intracranial venous structures were
acquired using MRV technique without intravenous contrast.

[Series 9: flow_[id number]_(id)_sinus_msum · sagittal · 1.0mm · 0.94mm/px · 18 of 64 slices shown]
[im 1/64]
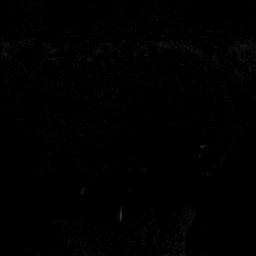
[im 4/64]
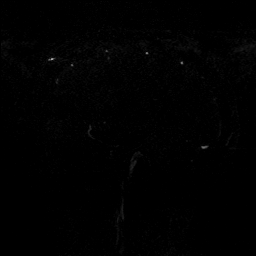
[im 8/64]
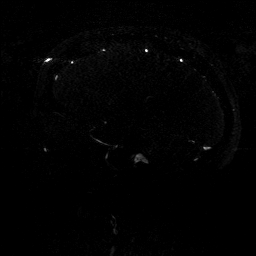
[im 12/64]
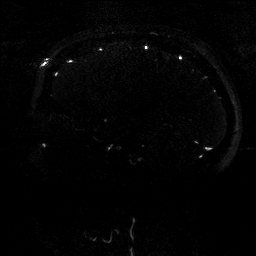
[im 15/64]
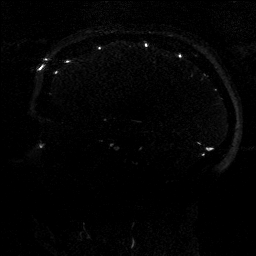
[im 19/64]
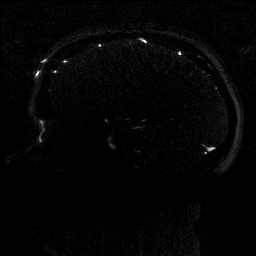
[im 23/64]
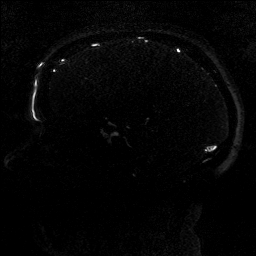
[im 26/64]
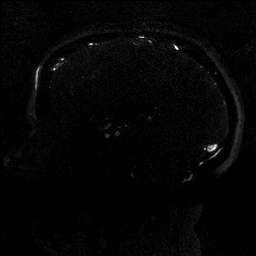
[im 30/64]
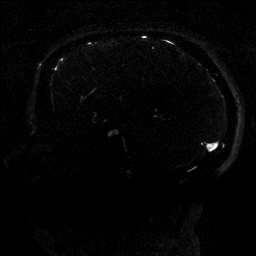
[im 34/64]
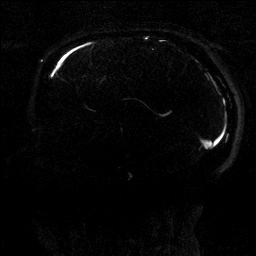
[im 38/64]
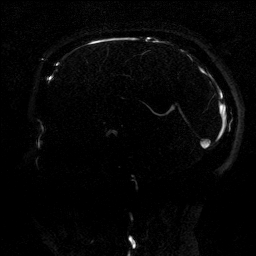
[im 41/64]
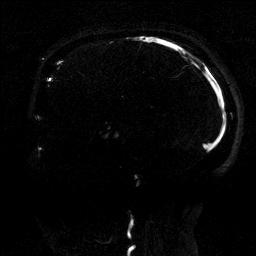
[im 45/64]
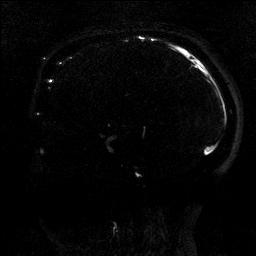
[im 49/64]
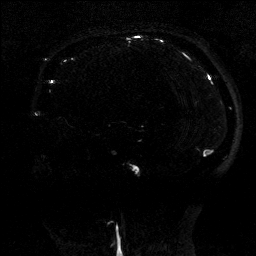
[im 52/64]
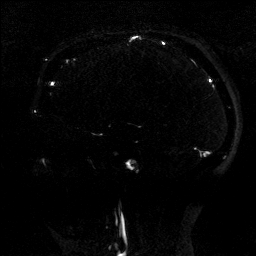
[im 56/64]
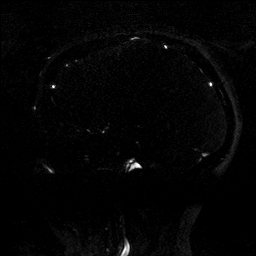
[im 60/64]
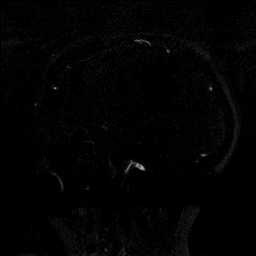
[im 64/64]
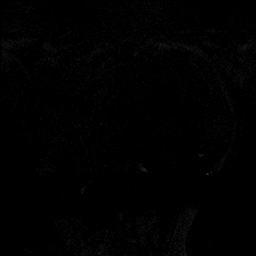

[Series 12: TOF · coronal · 2.5mm · 0.98mm/px · 30 of 110 slices shown]
[im 1/110]
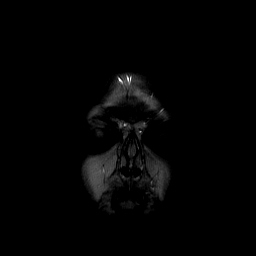
[im 4/110]
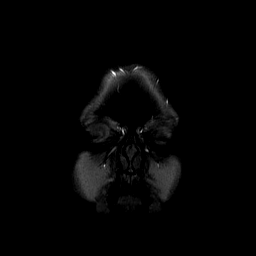
[im 8/110]
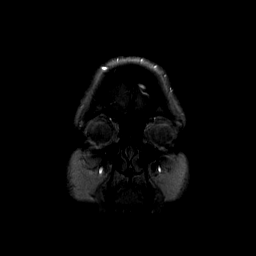
[im 12/110]
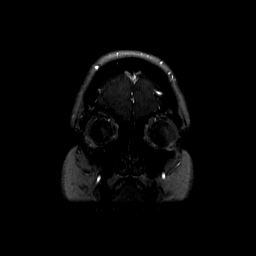
[im 16/110]
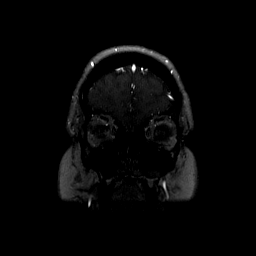
[im 19/110]
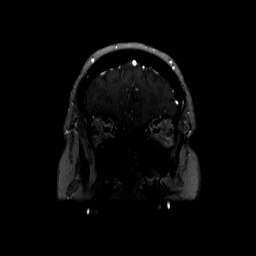
[im 23/110]
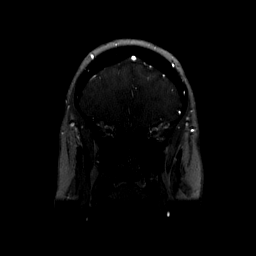
[im 27/110]
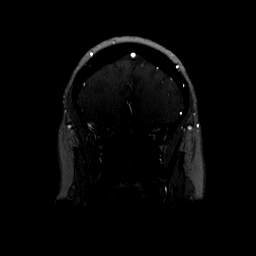
[im 31/110]
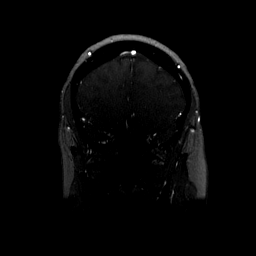
[im 34/110]
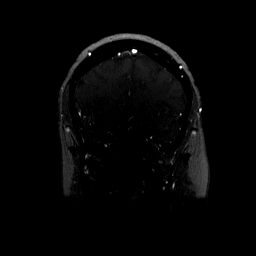
[im 38/110]
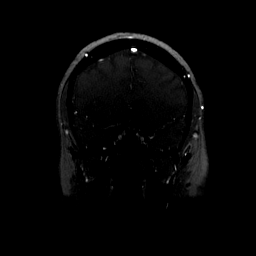
[im 42/110]
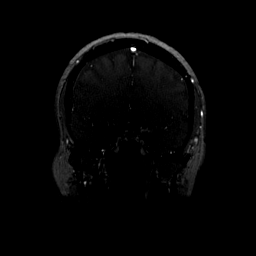
[im 46/110]
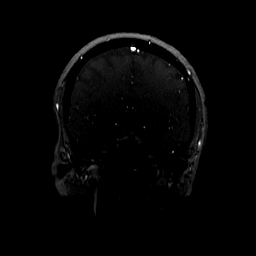
[im 49/110]
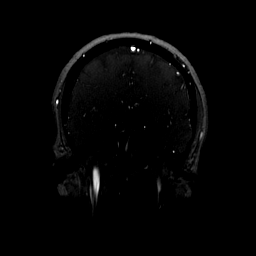
[im 53/110]
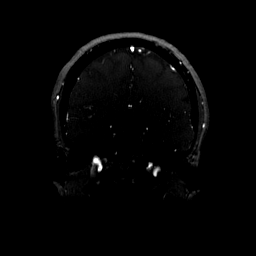
[im 57/110]
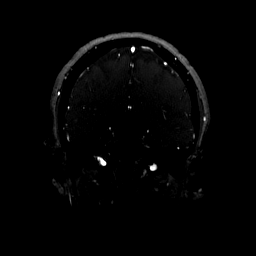
[im 61/110]
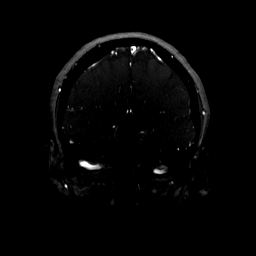
[im 64/110]
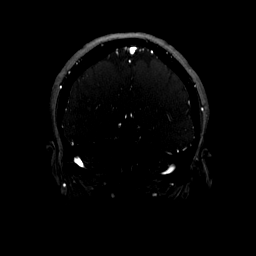
[im 68/110]
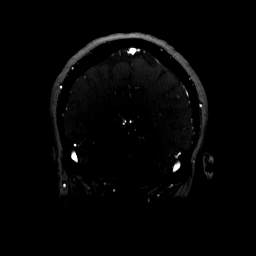
[im 72/110]
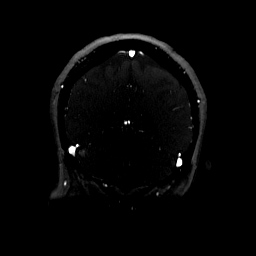
[im 76/110]
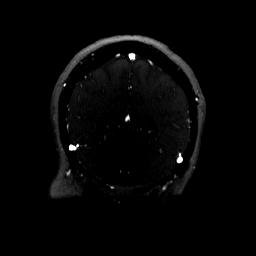
[im 79/110]
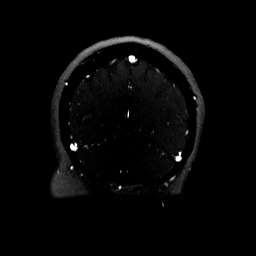
[im 83/110]
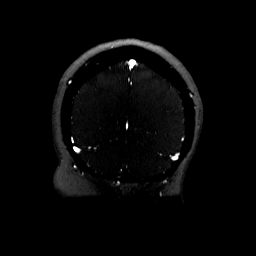
[im 87/110]
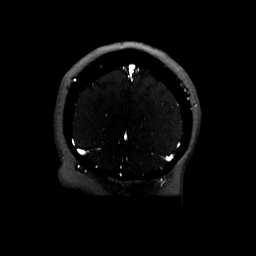
[im 91/110]
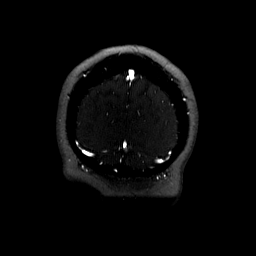
[im 94/110]
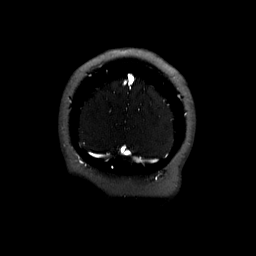
[im 98/110]
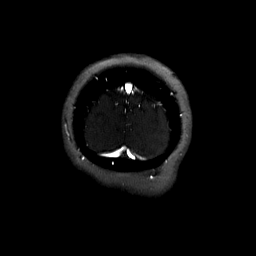
[im 102/110]
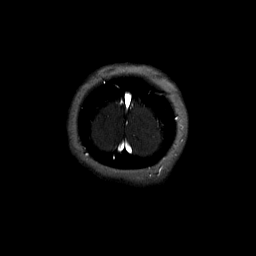
[im 106/110]
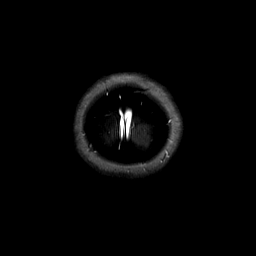
[im 110/110]
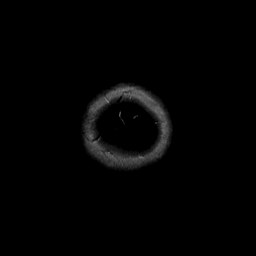

[48 of 48 positions shown; findings below may reference images not displayed]

FINDINGS: Sagittal and coronal time-of-flight MRV imaging.

Preserved flow signal in the superior sagittal sinus, torcula,
straight sinus, vein of RUDI, internal cerebral veins. Preserved
flow signal in fairly codominant bilateral transverse sinuses,
sigmoid sinuses, and IJ bulbs. And preserved flow signal in the
major draining cortical veins such as [place] and [place]. No
asymmetric or abnormal internal auditory region flow signal is
identified.

Cerebral volume appears stable from last year. No midline shift. No
ventriculomegaly.
IMPRESSION: Negative intracranial MRV. No evidence of dural venous sinus
thrombosis.

## 2021-05-29 MED ORDER — GADOBENATE DIMEGLUMINE 529 MG/ML IV SOLN
20.0000 mL | Freq: Once | INTRAVENOUS | Status: AC | PRN
  Administered 2021-05-29: 20 mL via INTRAVENOUS

## 2021-08-03 DIAGNOSIS — Q048 Other specified congenital malformations of brain: Secondary | ICD-10-CM | POA: Diagnosis not present

## 2021-08-03 DIAGNOSIS — M5481 Occipital neuralgia: Secondary | ICD-10-CM | POA: Diagnosis not present

## 2021-08-03 DIAGNOSIS — M542 Cervicalgia: Secondary | ICD-10-CM | POA: Diagnosis not present

## 2021-08-07 DIAGNOSIS — M542 Cervicalgia: Secondary | ICD-10-CM | POA: Diagnosis not present

## 2021-08-07 DIAGNOSIS — M546 Pain in thoracic spine: Secondary | ICD-10-CM | POA: Diagnosis not present

## 2021-08-07 DIAGNOSIS — R519 Headache, unspecified: Secondary | ICD-10-CM | POA: Diagnosis not present

## 2021-08-23 ENCOUNTER — Other Ambulatory Visit: Payer: Self-pay | Admitting: Student

## 2021-08-23 ENCOUNTER — Other Ambulatory Visit: Payer: Self-pay | Admitting: Orthopedic Surgery

## 2021-08-23 DIAGNOSIS — M542 Cervicalgia: Secondary | ICD-10-CM

## 2021-08-23 DIAGNOSIS — M5481 Occipital neuralgia: Secondary | ICD-10-CM

## 2021-09-04 DIAGNOSIS — R519 Headache, unspecified: Secondary | ICD-10-CM | POA: Diagnosis not present

## 2021-09-04 DIAGNOSIS — M546 Pain in thoracic spine: Secondary | ICD-10-CM | POA: Diagnosis not present

## 2021-09-04 DIAGNOSIS — M542 Cervicalgia: Secondary | ICD-10-CM | POA: Diagnosis not present

## 2021-09-06 DIAGNOSIS — M542 Cervicalgia: Secondary | ICD-10-CM | POA: Diagnosis not present

## 2021-09-06 DIAGNOSIS — R519 Headache, unspecified: Secondary | ICD-10-CM | POA: Diagnosis not present

## 2021-09-06 DIAGNOSIS — M546 Pain in thoracic spine: Secondary | ICD-10-CM | POA: Diagnosis not present

## 2021-12-10 DIAGNOSIS — G932 Benign intracranial hypertension: Secondary | ICD-10-CM | POA: Diagnosis not present

## 2022-04-03 DIAGNOSIS — S9032XA Contusion of left foot, initial encounter: Secondary | ICD-10-CM | POA: Diagnosis not present

## 2022-04-03 DIAGNOSIS — L729 Follicular cyst of the skin and subcutaneous tissue, unspecified: Secondary | ICD-10-CM | POA: Diagnosis not present

## 2022-04-03 DIAGNOSIS — Q048 Other specified congenital malformations of brain: Secondary | ICD-10-CM | POA: Diagnosis not present

## 2022-04-03 DIAGNOSIS — E559 Vitamin D deficiency, unspecified: Secondary | ICD-10-CM | POA: Diagnosis not present

## 2022-04-03 DIAGNOSIS — G43119 Migraine with aura, intractable, without status migrainosus: Secondary | ICD-10-CM | POA: Diagnosis not present

## 2022-04-09 DIAGNOSIS — S93602A Unspecified sprain of left foot, initial encounter: Secondary | ICD-10-CM | POA: Diagnosis not present

## 2022-06-16 ENCOUNTER — Other Ambulatory Visit: Payer: Self-pay | Admitting: Family Medicine

## 2022-06-16 DIAGNOSIS — N920 Excessive and frequent menstruation with regular cycle: Secondary | ICD-10-CM
